# Patient Record
Sex: Male | Born: 1965 | Race: White | Hispanic: No | Marital: Single | Smoking: Current every day smoker
Health system: Southern US, Community
[De-identification: ages and names within clinical notes are randomized; demographics above are authoritative.]

## PROBLEM LIST (undated history)

## (undated) DIAGNOSIS — R519 Headache, unspecified: Secondary | ICD-10-CM

## (undated) DIAGNOSIS — M792 Neuralgia and neuritis, unspecified: Secondary | ICD-10-CM

## (undated) DIAGNOSIS — K439 Ventral hernia without obstruction or gangrene: Secondary | ICD-10-CM

## (undated) DIAGNOSIS — M5412 Radiculopathy, cervical region: Secondary | ICD-10-CM

## (undated) DIAGNOSIS — G4730 Sleep apnea, unspecified: Secondary | ICD-10-CM

## (undated) DIAGNOSIS — I10 Essential (primary) hypertension: Secondary | ICD-10-CM

## (undated) DIAGNOSIS — M1990 Unspecified osteoarthritis, unspecified site: Secondary | ICD-10-CM

## (undated) HISTORY — PX: OTHER SURGICAL HISTORY: SHX169

## (undated) HISTORY — PX: POSTERIOR FUSION LUMBAR SPINE: SUR632

## (undated) HISTORY — PX: APPENDECTOMY: SHX54

## (undated) HISTORY — PX: LUMBAR DISC SURGERY: SHX700

## (undated) HISTORY — PX: COLONOSCOPY: SHX174

## (undated) HISTORY — PX: TONSILLECTOMY: SUR1361

## (undated) HISTORY — PX: CERVICAL SPINE SURGERY: SHX589

## (undated) HISTORY — PX: BACK SURGERY: SHX140

## (undated) HISTORY — PX: SHOULDER ARTHROSCOPY: SHX128

## (undated) HISTORY — PX: NASAL ENDOSCOPY: SHX286

---

## 2019-01-12 ENCOUNTER — Other Ambulatory Visit: Payer: Self-pay | Admitting: Orthopedic Surgery

## 2019-01-15 ENCOUNTER — Other Ambulatory Visit (HOSPITAL_COMMUNITY)
Admission: RE | Admit: 2019-01-15 | Discharge: 2019-01-15 | Disposition: A | Discharge status: Home / Self Care | Payer: Worker's Compensation | Source: Ambulatory Visit | Attending: Orthopedic Surgery | Admitting: Orthopedic Surgery

## 2019-01-15 DIAGNOSIS — Z0184 Encounter for antibody response examination: Secondary | ICD-10-CM | POA: Diagnosis not present

## 2019-01-15 DIAGNOSIS — I10 Essential (primary) hypertension: Secondary | ICD-10-CM | POA: Diagnosis not present

## 2019-01-15 DIAGNOSIS — Z87891 Personal history of nicotine dependence: Secondary | ICD-10-CM | POA: Diagnosis not present

## 2019-01-15 DIAGNOSIS — M4802 Spinal stenosis, cervical region: Secondary | ICD-10-CM | POA: Diagnosis not present

## 2019-01-15 DIAGNOSIS — M5412 Radiculopathy, cervical region: Secondary | ICD-10-CM | POA: Diagnosis present

## 2019-01-15 DIAGNOSIS — G4730 Sleep apnea, unspecified: Secondary | ICD-10-CM | POA: Diagnosis not present

## 2019-01-15 DIAGNOSIS — Z79899 Other long term (current) drug therapy: Secondary | ICD-10-CM | POA: Diagnosis not present

## 2019-01-15 DIAGNOSIS — Z20828 Contact with and (suspected) exposure to other viral communicable diseases: Secondary | ICD-10-CM | POA: Diagnosis not present

## 2019-01-15 LAB — SARS CORONAVIRUS 2 (TAT 6-24 HRS): SARS Coronavirus 2: NEGATIVE

## 2019-01-16 NOTE — Progress Notes (Signed)
[facility] - [place], [place] - [id number] [place] [id number] Phone: [phone] Fax: [phone]      Your procedure is scheduled on January 18, 2019.  Report to [facility] Main Entrance "A" at 09:00 A.M., and check in at the Admitting office.  Call this number if you have problems the morning of surgery:  [phone]  Call [phone] if you have any questions prior to your surgery date Monday-Friday 8am-4pm    Remember:  Do not eat after midnight the night before your surgery  You may drink clear liquids until 9:00am the morning of your surgery.   Clear liquids allowed are: Water, Non-Citrus Juices (without pulp), Carbonated Beverages, Clear Tea, Black Coffee Only, and Gatorade.   Enhanced Recovery after Surgery for Orthopedics Enhanced Recovery after Surgery is a protocol used to improve the stress on your body and your recovery after surgery.  Patient Instructions  . The night before surgery:  o No food after midnight. ONLY clear liquids after midnight  .  Marland Kitchen. The day of surgery (if you do NOT have diabetes):  o Drink ONE (1) Pre-Surgery Clear Ensure as directed.   o This drink was given to you during your hospital  pre-op appointment visit. o The pre-op nurse will instruct you on the time to drink the  Pre-Surgery Ensure depending on your surgery time. o Finish the drink at the designated time by the pre-op nurse.  o Nothing else to drink after completing the  Pre-Surgery Clear Ensure.    Take these medicines the morning of surgery with A SIP OF WATER : NONE  7 days prior to surgery STOP taking any Aspirin (unless otherwise instructed by your surgeon), Aleve, Naproxen, Ibuprofen, Motrin, Advil, Goody's, BC's, all herbal medications, fish oil, and all vitamins.    The Morning of Surgery  Do not wear jewelry.  Do not wear lotions, powders, or perfumes/colognes, or deodorant  Do not shave 48 hours prior to surgery.  Men may shave  face and neck.  Do not bring valuables to the hospital.  [facility] is not responsible for any belongings or valuables.  If you are a smoker, DO NOT Smoke 24 hours prior to surgery  IF you wear a CPAP at night please bring your mask, tubing, and machine the morning of surgery   Remember that you must have someone to transport you home after your surgery, and remain with you for 24 hours if you are discharged the same day.   Contacts, glasses, hearing aids, dentures or bridgework may not be worn into surgery.    Leave your suitcase in the car.  After surgery it may be brought to your room.  For patients admitted to the hospital, discharge time will be determined by your treatment team.  Patients discharged the day of surgery will not be allowed to drive home.    Special instructions:   [facility]- Preparing For Surgery  Before surgery, you can play an important role. Because skin is not sterile, your skin needs to be as free of germs as possible. You can reduce the number of germs on your skin by washing with CHG (chlorahexidine gluconate) Soap before surgery.  CHG is an antiseptic cleaner which kills germs and bonds with the skin to continue killing germs even after washing.    Oral Hygiene is also important to reduce your risk of infection.  Remember - BRUSH YOUR TEETH THE MORNING OF SURGERY WITH YOUR  REGULAR TOOTHPASTE  Please do not use if you have an allergy to CHG or antibacterial soaps. If your skin becomes reddened/irritated stop using the CHG.  Do not shave (including legs and underarms) for at least 48 hours prior to first CHG shower. It is OK to shave your face.  Please follow these instructions carefully.   1. Shower the NIGHT BEFORE SURGERY and the MORNING OF SURGERY with CHG Soap.   2. If you chose to wash your hair, wash your hair first as usual with your normal shampoo.  3. After you shampoo, rinse your hair and body thoroughly to remove the shampoo.  4. Use  CHG as you would any other liquid soap. You can apply CHG directly to the skin and wash gently with a scrungie or a clean washcloth.   5. Apply the CHG Soap to your body ONLY FROM THE NECK DOWN.  Do not use on open wounds or open sores. Avoid contact with your eyes, ears, mouth and genitals (private parts). Wash Face and genitals (private parts)  with your normal soap.   6. Wash thoroughly, paying special attention to the area where your surgery will be performed.  7. Thoroughly rinse your body with warm water from the neck down.  8. DO NOT shower/wash with your normal soap after using and rinsing off the CHG Soap.  9. Pat yourself dry with a CLEAN TOWEL.  10. Wear CLEAN PAJAMAS to bed the night before surgery, wear comfortable clothes the morning of surgery  11. Place CLEAN SHEETS on your bed the night of your first shower and DO NOT SLEEP WITH PETS.    Day of Surgery:  Do not apply any deodorants/lotions. Please shower the morning of surgery with the CHG soap  Please wear clean clothes to the hospital/surgery center.   Remember to brush your teeth WITH YOUR REGULAR TOOTHPASTE.   Please read over the following fact sheets that you were given.

## 2019-01-17 ENCOUNTER — Other Ambulatory Visit: Payer: Self-pay

## 2019-01-17 ENCOUNTER — Encounter (HOSPITAL_COMMUNITY): Payer: Self-pay

## 2019-01-17 ENCOUNTER — Encounter (HOSPITAL_COMMUNITY)
Admission: RE | Admit: 2019-01-17 | Discharge: 2019-01-17 | Disposition: A | Discharge status: Home / Self Care | Payer: Worker's Compensation | Source: Ambulatory Visit | Attending: Orthopedic Surgery | Admitting: Orthopedic Surgery

## 2019-01-17 DIAGNOSIS — Z01818 Encounter for other preprocedural examination: Secondary | ICD-10-CM | POA: Insufficient documentation

## 2019-01-17 DIAGNOSIS — I10 Essential (primary) hypertension: Secondary | ICD-10-CM | POA: Insufficient documentation

## 2019-01-17 DIAGNOSIS — Z20828 Contact with and (suspected) exposure to other viral communicable diseases: Secondary | ICD-10-CM | POA: Diagnosis not present

## 2019-01-17 DIAGNOSIS — M5412 Radiculopathy, cervical region: Secondary | ICD-10-CM | POA: Diagnosis not present

## 2019-01-17 DIAGNOSIS — M4802 Spinal stenosis, cervical region: Secondary | ICD-10-CM | POA: Diagnosis not present

## 2019-01-17 HISTORY — DX: Neuralgia and neuritis, unspecified: M79.2

## 2019-01-17 HISTORY — DX: Sleep apnea, unspecified: G47.30

## 2019-01-17 HISTORY — DX: Radiculopathy, cervical region: M54.12

## 2019-01-17 HISTORY — DX: Essential (primary) hypertension: I10

## 2019-01-17 LAB — COMPREHENSIVE METABOLIC PANEL
ALT: 33 U/L (ref 0–44)
AST: 19 U/L (ref 15–41)
Albumin: 4.3 g/dL (ref 3.5–5.0)
Alkaline Phosphatase: 55 U/L (ref 38–126)
Anion gap: 11 (ref 5–15)
BUN: 17 mg/dL (ref 6–20)
CO2: 25 mmol/L (ref 22–32)
Calcium: 9.5 mg/dL (ref 8.9–10.3)
Chloride: 101 mmol/L (ref 98–111)
Creatinine, Ser: 0.91 mg/dL (ref 0.61–1.24)
GFR calc Af Amer: >60 mL/min (ref >60)
GFR calc non Af Amer: >60 mL/min (ref >60)
Glucose, Bld: 115 mg/dL — ABNORMAL HIGH (ref 70–99)
Potassium: 4.4 mmol/L (ref 3.5–5.1)
Sodium: 137 mmol/L (ref 135–145)
Total Bilirubin: 1.4 mg/dL — ABNORMAL HIGH (ref 0.3–1.2)
Total Protein: 7.7 g/dL (ref 6.5–8.1)

## 2019-01-17 LAB — ABO/RH: ABO/RH(D): A NEG

## 2019-01-17 LAB — CBC WITH DIFFERENTIAL/PLATELET
Abs Immature Granulocytes: 0.06 10*3/uL (ref 0.00–0.07)
Basophils Absolute: 0.1 10*3/uL (ref 0.0–0.1)
Basophils Relative: 1 %
Eosinophils Absolute: 0.1 10*3/uL (ref 0.0–0.5)
Eosinophils Relative: 1 %
HCT: 48.3 % (ref 39.0–52.0)
Hemoglobin: 16.1 g/dL (ref 13.0–17.0)
Immature Granulocytes: 1 %
Lymphocytes Relative: 30 %
Lymphs Abs: 2.2 10*3/uL (ref 0.7–4.0)
MCH: 31 pg (ref 26.0–34.0)
MCHC: 33.3 g/dL (ref 30.0–36.0)
MCV: 93.1 fL (ref 80.0–100.0)
Monocytes Absolute: 0.5 10*3/uL (ref 0.1–1.0)
Monocytes Relative: 8 %
Neutro Abs: 4.2 10*3/uL (ref 1.7–7.7)
Neutrophils Relative %: 59 %
Platelets: 299 10*3/uL (ref 150–400)
RBC: 5.19 MIL/uL (ref 4.22–5.81)
RDW: 12.6 % (ref 11.5–15.5)
WBC: 7.1 10*3/uL (ref 4.0–10.5)
nRBC: 0 % (ref 0.0–0.2)

## 2019-01-17 LAB — URINALYSIS, ROUTINE W REFLEX MICROSCOPIC
Bilirubin Urine: NEGATIVE
Glucose, UA: NEGATIVE mg/dL
Hgb urine dipstick: NEGATIVE
Ketones, ur: NEGATIVE mg/dL
Leukocytes,Ua: NEGATIVE
Nitrite: NEGATIVE
Protein, ur: NEGATIVE mg/dL
Specific Gravity, Urine: 1.017 (ref 1.005–1.030)
pH: 5 (ref 5.0–8.0)

## 2019-01-17 LAB — SURGICAL PCR SCREEN
MRSA, PCR: NEGATIVE
Staphylococcus aureus: POSITIVE — AB

## 2019-01-17 LAB — APTT: aPTT: 30 seconds (ref 24–36)

## 2019-01-17 LAB — TYPE AND SCREEN
ABO/RH(D): A NEG
Antibody Screen: NEGATIVE

## 2019-01-17 LAB — PROTIME-INR
INR: 1 (ref 0.8–1.2)
Prothrombin Time: 12.8 seconds (ref 11.4–15.2)

## 2019-01-17 MED ORDER — DEXTROSE 5 % IV SOLN
3.0000 g | INTRAVENOUS | Status: AC
  Administered 2019-01-18: 3 g via INTRAVENOUS
  Filled 2019-01-17: qty 3000

## 2019-01-17 NOTE — Progress Notes (Signed)
PCP -  Dr. Dian Situ Cardiologist - denies  Chest x-ray - denies EKG - 01/17/2019 Stress Test - 05/26/17 - [facility] ECHO - patient not sure Cardiac Cath - denies  Sleep Study - YES CPAP - YES  Blood Thinner Instructions: N/A Aspirin Instructions: N/A  Anesthesia review: YES, cardiac hx - cardiac records requested  Coronavirus Screening  Have you experienced the following symptoms:  Cough yes/no: No Fever (>100.5F)  yes/no: No Runny nose yes/no: No Sore throat yes/no: No Difficulty breathing/shortness of breath  yes/no: No  Have you or a family member traveled in the last 14 days and where? yes/no: No  If the patient indicates "YES" to the above questions, their PAT will be rescheduled to limit the exposure to others and, the surgeon will be notified. THE PATIENT WILL NEED TO BE ASYMPTOMATIC FOR 14 DAYS.   If the patient is not experiencing any of these symptoms, the PAT nurse will instruct them to NOT bring anyone with them to their appointment since they may have these symptoms or traveled as well.   Please remind your patients and families that hospital visitation restrictions are in effect and the importance of the restrictions.   Patient denies shortness of breath, fever, cough and chest pain at PAT appointment  Patient verbalized understanding of instructions that were given to them at the PAT appointment. Patient was also instructed that they will need to review over the PAT instructions again at home before surgery.

## 2019-01-17 NOTE — Anesthesia Preprocedure Evaluation (Addendum)
Anesthesia Evaluation  Patient identified by MRN, date of birth, ID band Patient awake    Reviewed: Allergy & Precautions, NPO status , Patient's Chart, lab work & pertinent test results  History of Anesthesia Complications Negative for: history of anesthetic complications  Airway Mallampati: III  TM Distance: >3 FB Neck ROM: Full    Dental  (+) Dental Advisory Given   Pulmonary neg shortness of breath, sleep apnea and Continuous Positive Airway Pressure Ventilation , neg recent URI, Not current smoker, former smoker,    breath sounds clear to auscultation       Cardiovascular hypertension, Pt. on medications (-) angina(-) Past MI  Rhythm:Regular     Neuro/Psych  Neuromuscular disease negative psych ROS   GI/Hepatic negative GI ROS, Neg liver ROS,   Endo/Other  Morbid obesity  Renal/GU negative Renal ROS     Musculoskeletal negative musculoskeletal ROS (+)   Abdominal   Peds  Hematology negative hematology ROS (+)   Anesthesia Other Findings   Reproductive/Obstetrics                            Anesthesia Physical Anesthesia Plan  ASA: III  Anesthesia Plan: General   Post-op Pain Management:    Induction: Intravenous  PONV Risk Score and Plan: 2 and Ondansetron and Dexamethasone  Airway Management Planned: Video Laryngoscope Planned and Oral ETT  Additional Equipment: None  Intra-op Plan:   Post-operative Plan: Extubation in OR  Informed Consent: I have reviewed the patients History and Physical, chart, labs and discussed the procedure including the risks, benefits and alternatives for the proposed anesthesia with the patient or authorized representative who has indicated his/her understanding and acceptance.     Dental advisory given  Plan Discussed with: CRNA and Surgeon  Anesthesia Plan Comments: (Pt had stress test in 2019 ordered by PCP to eval chest  tightness/pressure. Scan showed no ischemia, borderline low EF 44%. No further testing was recommended per notes.   Anesthesia records from 01/2018 for nasal septum reconstruction/turbinate reduction at [facility] indicate difficult airway. Unsuccessful direct laryngoscopy followed by successful glidescope.   Nuclear stress 05/26/17 (care everywhere): 1. No inducible ischemia.  2. Borderline low left ventricular function.EF 44%.   )      Anesthesia Quick Evaluation

## 2019-01-18 ENCOUNTER — Ambulatory Visit (HOSPITAL_COMMUNITY): Payer: Worker's Compensation | Admitting: Certified Registered Nurse Anesthetist

## 2019-01-18 ENCOUNTER — Ambulatory Visit (HOSPITAL_COMMUNITY)
Admission: RE | Admit: 2019-01-18 | Discharge: 2019-01-19 | Disposition: A | Discharge status: Home / Self Care | Payer: Worker's Compensation | Attending: Orthopedic Surgery | Admitting: Orthopedic Surgery

## 2019-01-18 ENCOUNTER — Other Ambulatory Visit: Payer: Self-pay

## 2019-01-18 ENCOUNTER — Ambulatory Visit (HOSPITAL_COMMUNITY): Payer: Worker's Compensation

## 2019-01-18 ENCOUNTER — Encounter (HOSPITAL_COMMUNITY)
Admission: RE | Disposition: A | Discharge status: Home / Self Care | Payer: Self-pay | Source: Home / Self Care | Attending: Orthopedic Surgery

## 2019-01-18 ENCOUNTER — Ambulatory Visit (HOSPITAL_COMMUNITY): Payer: Worker's Compensation | Admitting: Physician Assistant

## 2019-01-18 ENCOUNTER — Encounter (HOSPITAL_COMMUNITY): Payer: Self-pay | Admitting: *Deleted

## 2019-01-18 DIAGNOSIS — Z87891 Personal history of nicotine dependence: Secondary | ICD-10-CM | POA: Insufficient documentation

## 2019-01-18 DIAGNOSIS — I10 Essential (primary) hypertension: Secondary | ICD-10-CM | POA: Diagnosis not present

## 2019-01-18 DIAGNOSIS — M4802 Spinal stenosis, cervical region: Secondary | ICD-10-CM | POA: Insufficient documentation

## 2019-01-18 DIAGNOSIS — Z0184 Encounter for antibody response examination: Secondary | ICD-10-CM | POA: Insufficient documentation

## 2019-01-18 DIAGNOSIS — M5412 Radiculopathy, cervical region: Secondary | ICD-10-CM | POA: Insufficient documentation

## 2019-01-18 DIAGNOSIS — M5410 Radiculopathy, site unspecified: Secondary | ICD-10-CM | POA: Diagnosis present

## 2019-01-18 DIAGNOSIS — G4730 Sleep apnea, unspecified: Secondary | ICD-10-CM | POA: Insufficient documentation

## 2019-01-18 DIAGNOSIS — Z419 Encounter for procedure for purposes other than remedying health state, unspecified: Secondary | ICD-10-CM

## 2019-01-18 DIAGNOSIS — Z20828 Contact with and (suspected) exposure to other viral communicable diseases: Secondary | ICD-10-CM | POA: Insufficient documentation

## 2019-01-18 DIAGNOSIS — Z79899 Other long term (current) drug therapy: Secondary | ICD-10-CM | POA: Insufficient documentation

## 2019-01-18 HISTORY — PX: ANTERIOR CERVICAL DECOMP/DISCECTOMY FUSION: SHX1161

## 2019-01-18 SURGERY — ANTERIOR CERVICAL DECOMPRESSION/DISCECTOMY FUSION 2 LEVELS
Anesthesia: General | Site: Spine Cervical

## 2019-01-18 MED ORDER — PANTOPRAZOLE SODIUM 40 MG IV SOLR: 40.0000 mg | Freq: Every day | INTRAVENOUS | Status: DC

## 2019-01-18 MED ORDER — DEXMEDETOMIDINE HCL 200 MCG/2ML IV SOLN
INTRAVENOUS | Status: DC | PRN
  Administered 2019-01-18 (×3): 20 ug via INTRAVENOUS

## 2019-01-18 MED ORDER — SODIUM CHLORIDE 0.9% FLUSH
3.0000 mL | Freq: Two times a day (BID) | INTRAVENOUS | Status: DC
  Administered 2019-01-18: 3 mL via INTRAVENOUS

## 2019-01-18 MED ORDER — SODIUM CHLORIDE 0.9% FLUSH: 3.0000 mL | INTRAVENOUS | Status: DC | PRN

## 2019-01-18 MED ORDER — ACETAMINOPHEN 325 MG PO TABS: 650.0000 mg | ORAL_TABLET | ORAL | Status: DC | PRN

## 2019-01-18 MED ORDER — ONDANSETRON HCL 4 MG/2ML IJ SOLN
INTRAMUSCULAR | Status: DC | PRN
  Administered 2019-01-18: 4 mg via INTRAVENOUS

## 2019-01-18 MED ORDER — THROMBIN 20000 UNITS EX SOLR
CUTANEOUS | Status: AC
  Filled 2019-01-18: qty 20000

## 2019-01-18 MED ORDER — DEXAMETHASONE SODIUM PHOSPHATE 10 MG/ML IJ SOLN
INTRAMUSCULAR | Status: AC
  Filled 2019-01-18: qty 1

## 2019-01-18 MED ORDER — 0.9 % SODIUM CHLORIDE (POUR BTL) OPTIME
TOPICAL | Status: DC | PRN
  Administered 2019-01-18: 1000 mL

## 2019-01-18 MED ORDER — SUCCINYLCHOLINE CHLORIDE 200 MG/10ML IV SOSY
PREFILLED_SYRINGE | INTRAVENOUS | Status: DC | PRN
  Administered 2019-01-18: 120 mg via INTRAVENOUS

## 2019-01-18 MED ORDER — LIDOCAINE 20MG/ML (2%) 15 ML SYRINGE OPTIME
INTRAMUSCULAR | Status: DC | PRN
  Administered 2019-01-18: 80 mg via INTRAVENOUS

## 2019-01-18 MED ORDER — ACETAMINOPHEN 160 MG/5ML PO SOLN: 1000.0000 mg | Freq: Once | ORAL | Status: DC | PRN

## 2019-01-18 MED ORDER — PROPOFOL 10 MG/ML IV BOLUS
INTRAVENOUS | Status: AC
  Filled 2019-01-18: qty 20

## 2019-01-18 MED ORDER — THROMBIN 20000 UNITS EX SOLR
CUTANEOUS | Status: DC | PRN
  Administered 2019-01-18: 20 mL via TOPICAL

## 2019-01-18 MED ORDER — PANTOPRAZOLE SODIUM 40 MG PO TBEC
40.0000 mg | DELAYED_RELEASE_TABLET | Freq: Every day | ORAL | Status: DC
  Administered 2019-01-18: 21:00:00 40 mg via ORAL
  Filled 2019-01-18: qty 1

## 2019-01-18 MED ORDER — ALUM & MAG HYDROXIDE-SIMETH 200-200-20 MG/5ML PO SUSP: 30.0000 mL | Freq: Four times a day (QID) | ORAL | Status: DC | PRN

## 2019-01-18 MED ORDER — ACETAMINOPHEN 650 MG RE SUPP: 650.0000 mg | RECTAL | Status: DC | PRN

## 2019-01-18 MED ORDER — FENTANYL CITRATE (PF) 250 MCG/5ML IJ SOLN
INTRAMUSCULAR | Status: AC
  Filled 2019-01-18: qty 5

## 2019-01-18 MED ORDER — ONDANSETRON HCL 4 MG/2ML IJ SOLN
4.0000 mg | Freq: Four times a day (QID) | INTRAMUSCULAR | Status: DC | PRN
  Administered 2019-01-19: 4 mg via INTRAVENOUS
  Filled 2019-01-18: qty 2

## 2019-01-18 MED ORDER — MIDAZOLAM HCL 5 MG/5ML IJ SOLN
INTRAMUSCULAR | Status: DC | PRN
  Administered 2019-01-18: 2 mg via INTRAVENOUS

## 2019-01-18 MED ORDER — POVIDONE-IODINE 7.5 % EX SOLN
Freq: Once | CUTANEOUS | Status: DC
  Filled 2019-01-18: qty 118

## 2019-01-18 MED ORDER — PROPOFOL 10 MG/ML IV BOLUS
INTRAVENOUS | Status: DC | PRN
  Administered 2019-01-18: 40 mg via INTRAVENOUS
  Administered 2019-01-18: 140 mg via INTRAVENOUS

## 2019-01-18 MED ORDER — CEFAZOLIN SODIUM-DEXTROSE 2-4 GM/100ML-% IV SOLN
2.0000 g | Freq: Three times a day (TID) | INTRAVENOUS | Status: AC
  Administered 2019-01-18 (×2): 2 g via INTRAVENOUS
  Filled 2019-01-18 (×2): qty 100

## 2019-01-18 MED ORDER — OXYCODONE-ACETAMINOPHEN 5-325 MG PO TABS
1.0000 | ORAL_TABLET | ORAL | Status: DC | PRN
  Administered 2019-01-18 – 2019-01-19 (×5): 2 via ORAL
  Filled 2019-01-18 (×5): qty 2

## 2019-01-18 MED ORDER — PHENOL 1.4 % MT LIQD: 1.0000 | OROMUCOSAL | Status: DC | PRN

## 2019-01-18 MED ORDER — ROCURONIUM BROMIDE 10 MG/ML (PF) SYRINGE
PREFILLED_SYRINGE | INTRAVENOUS | Status: AC
  Filled 2019-01-18: qty 10

## 2019-01-18 MED ORDER — ONDANSETRON HCL 4 MG PO TABS: 4.0000 mg | ORAL_TABLET | Freq: Four times a day (QID) | ORAL | Status: DC | PRN

## 2019-01-18 MED ORDER — OXYCODONE HCL 5 MG PO TABS: 5.0000 mg | ORAL_TABLET | Freq: Once | ORAL | Status: DC | PRN

## 2019-01-18 MED ORDER — LIDOCAINE 2% (20 MG/ML) 5 ML SYRINGE
INTRAMUSCULAR | Status: AC
  Filled 2019-01-18: qty 5

## 2019-01-18 MED ORDER — BISACODYL 5 MG PO TBEC
5.0000 mg | DELAYED_RELEASE_TABLET | Freq: Every day | ORAL | Status: DC | PRN
  Administered 2019-01-19: 5 mg via ORAL
  Filled 2019-01-18: qty 1

## 2019-01-18 MED ORDER — BUPIVACAINE-EPINEPHRINE (PF) 0.25% -1:200000 IJ SOLN
INTRAMUSCULAR | Status: AC
  Filled 2019-01-18: qty 30

## 2019-01-18 MED ORDER — ACETAMINOPHEN 10 MG/ML IV SOLN
1000.0000 mg | Freq: Once | INTRAVENOUS | Status: DC | PRN
  Administered 2019-01-18: 1000 mg via INTRAVENOUS

## 2019-01-18 MED ORDER — DIAZEPAM 5 MG PO TABS
5.0000 mg | ORAL_TABLET | Freq: Four times a day (QID) | ORAL | Status: DC | PRN
  Administered 2019-01-18 – 2019-01-19 (×3): 5 mg via ORAL
  Filled 2019-01-18 (×3): qty 1

## 2019-01-18 MED ORDER — SODIUM CHLORIDE 0.9 % IV SOLN: 250.0000 mL | INTRAVENOUS | Status: DC

## 2019-01-18 MED ORDER — ONDANSETRON HCL 4 MG/2ML IJ SOLN
INTRAMUSCULAR | Status: AC
  Filled 2019-01-18: qty 2

## 2019-01-18 MED ORDER — OXYCODONE HCL 5 MG PO TABS
ORAL_TABLET | ORAL | Status: AC
  Filled 2019-01-18: qty 1

## 2019-01-18 MED ORDER — FENTANYL CITRATE (PF) 100 MCG/2ML IJ SOLN
INTRAMUSCULAR | Status: AC
  Filled 2019-01-18: qty 2

## 2019-01-18 MED ORDER — SUGAMMADEX SODIUM 200 MG/2ML IV SOLN
INTRAVENOUS | Status: DC | PRN
  Administered 2019-01-18: 255.8 mg via INTRAVENOUS

## 2019-01-18 MED ORDER — ZOLPIDEM TARTRATE 5 MG PO TABS: 5.0000 mg | ORAL_TABLET | Freq: Every evening | ORAL | Status: DC | PRN

## 2019-01-18 MED ORDER — MIDAZOLAM HCL 2 MG/2ML IJ SOLN
INTRAMUSCULAR | Status: AC
  Filled 2019-01-18: qty 2

## 2019-01-18 MED ORDER — FENTANYL CITRATE (PF) 100 MCG/2ML IJ SOLN
25.0000 ug | INTRAMUSCULAR | Status: DC | PRN
  Administered 2019-01-18: 50 ug via INTRAVENOUS

## 2019-01-18 MED ORDER — ROCURONIUM BROMIDE 10 MG/ML (PF) SYRINGE
PREFILLED_SYRINGE | INTRAVENOUS | Status: DC | PRN
  Administered 2019-01-18: 60 mg via INTRAVENOUS

## 2019-01-18 MED ORDER — BUPIVACAINE-EPINEPHRINE 0.25% -1:200000 IJ SOLN
INTRAMUSCULAR | Status: DC | PRN
  Administered 2019-01-18: 8 mL

## 2019-01-18 MED ORDER — FLEET ENEMA 7-19 GM/118ML RE ENEM: 1.0000 | ENEMA | Freq: Once | RECTAL | Status: DC | PRN

## 2019-01-18 MED ORDER — ACETAMINOPHEN 500 MG PO TABS: 1000.0000 mg | ORAL_TABLET | Freq: Once | ORAL | Status: DC | PRN

## 2019-01-18 MED ORDER — LACTATED RINGERS IV SOLN
INTRAVENOUS | Status: DC
  Administered 2019-01-18: 09:00:00 via INTRAVENOUS

## 2019-01-18 MED ORDER — ACETAMINOPHEN 10 MG/ML IV SOLN
INTRAVENOUS | Status: AC
  Filled 2019-01-18: qty 100

## 2019-01-18 MED ORDER — SODIUM CHLORIDE 0.9 % IV SOLN
INTRAVENOUS | Status: DC | PRN
  Administered 2019-01-18: 25 ug/min via INTRAVENOUS

## 2019-01-18 MED ORDER — LISINOPRIL 10 MG PO TABS
10.0000 mg | ORAL_TABLET | Freq: Every day | ORAL | Status: DC
  Administered 2019-01-19: 10 mg via ORAL
  Filled 2019-01-18: qty 1

## 2019-01-18 MED ORDER — OXYCODONE HCL 5 MG/5ML PO SOLN: 5.0000 mg | Freq: Once | ORAL | Status: DC | PRN

## 2019-01-18 MED ORDER — FENTANYL CITRATE (PF) 100 MCG/2ML IJ SOLN
INTRAMUSCULAR | Status: DC | PRN
  Administered 2019-01-18: 50 ug via INTRAVENOUS
  Administered 2019-01-18: 150 ug via INTRAVENOUS
  Administered 2019-01-18: 50 ug via INTRAVENOUS

## 2019-01-18 MED ORDER — SENNOSIDES-DOCUSATE SODIUM 8.6-50 MG PO TABS: 1.0000 | ORAL_TABLET | Freq: Every evening | ORAL | Status: DC | PRN

## 2019-01-18 MED ORDER — MENTHOL 3 MG MT LOZG: 1.0000 | LOZENGE | OROMUCOSAL | Status: DC | PRN

## 2019-01-18 SURGICAL SUPPLY — 76 items
BENZOIN TINCTURE PRP APPL 2/3 (GAUZE/BANDAGES/DRESSINGS) ×2 IMPLANT
BIT DRILL NEURO 2X3.1 SFT TUCH (MISCELLANEOUS) ×1 IMPLANT
BIT DRILL SRG 14X2.2XFLT CHK (BIT) IMPLANT
BIT DRL SRG 14X2.2XFLT CHK (BIT) ×1
BLADE CLIPPER SURG (BLADE) ×2 IMPLANT
BLADE SURG 15 STRL LF DISP TIS (BLADE) ×1 IMPLANT
BLADE SURG 15 STRL SS (BLADE) ×1
BUR MATCHSTICK NEURO 3.0 LAGG (BURR) IMPLANT
CARTRIDGE OIL MAESTRO DRILL (MISCELLANEOUS) ×1 IMPLANT
COLLAR CERV LO CONTOUR FIRM DE (SOFTGOODS) IMPLANT
CORD BIPOLAR FORCEPS 12FT (ELECTRODE) ×2 IMPLANT
COVER SURGICAL LIGHT HANDLE (MISCELLANEOUS) ×2 IMPLANT
COVER WAND RF STERILE (DRAPES) ×2 IMPLANT
DECANTER SPIKE VIAL GLASS SM (MISCELLANEOUS) ×2 IMPLANT
DIFFUSER DRILL AIR PNEUMATIC (MISCELLANEOUS) ×2 IMPLANT
DRAIN JACKSON RD 7FR 3/32 (WOUND CARE) IMPLANT
DRAPE C-ARM 42X72 X-RAY (DRAPES) ×2 IMPLANT
DRAPE POUCH INSTRU U-SHP 10X18 (DRAPES) ×2 IMPLANT
DRAPE SURG 17X23 STRL (DRAPES) ×8 IMPLANT
DRILL BIT SKYLINE 14MM (BIT) ×1
DRILL NEURO 2X3.1 SOFT TOUCH (MISCELLANEOUS) ×2
DURAPREP 26ML APPLICATOR (WOUND CARE) ×2 IMPLANT
ELECT COATED BLADE 2.86 ST (ELECTRODE) ×2 IMPLANT
ELECT REM PT RETURN 9FT ADLT (ELECTROSURGICAL) ×2
ELECTRODE REM PT RTRN 9FT ADLT (ELECTROSURGICAL) ×1 IMPLANT
EVACUATOR SILICONE 100CC (DRAIN) IMPLANT
GAUZE 4X4 16PLY RFD (DISPOSABLE) ×2 IMPLANT
GAUZE SPONGE 4X4 12PLY STRL (GAUZE/BANDAGES/DRESSINGS) ×2 IMPLANT
GLOVE BIO SURGEON STRL SZ7 (GLOVE) ×2 IMPLANT
GLOVE BIO SURGEON STRL SZ8 (GLOVE) ×2 IMPLANT
GLOVE BIOGEL PI IND STRL 7.0 (GLOVE) ×2 IMPLANT
GLOVE BIOGEL PI IND STRL 8 (GLOVE) ×1 IMPLANT
GLOVE BIOGEL PI INDICATOR 7.0 (GLOVE) ×2
GLOVE BIOGEL PI INDICATOR 8 (GLOVE) ×1
GOWN STRL REUS W/ TWL LRG LVL3 (GOWN DISPOSABLE) ×1 IMPLANT
GOWN STRL REUS W/ TWL XL LVL3 (GOWN DISPOSABLE) ×1 IMPLANT
GOWN STRL REUS W/TWL LRG LVL3 (GOWN DISPOSABLE) ×1
GOWN STRL REUS W/TWL XL LVL3 (GOWN DISPOSABLE) ×1
INTERLOCK LRDTC CRVCL VBR 8MM (Peek) IMPLANT
IV CATH 14GX2 1/4 (CATHETERS) ×2 IMPLANT
KIT BASIN OR (CUSTOM PROCEDURE TRAY) ×2 IMPLANT
KIT TURNOVER KIT B (KITS) ×2 IMPLANT
LORDOTIC CERVICAL VBR 8MM SM (Peek) ×2 IMPLANT
MANIFOLD NEPTUNE II (INSTRUMENTS) ×2 IMPLANT
NDL PRECISIONGLIDE 27X1.5 (NEEDLE) ×1 IMPLANT
NDL SPNL 20GX3.5 QUINCKE YW (NEEDLE) ×1 IMPLANT
NEEDLE PRECISIONGLIDE 27X1.5 (NEEDLE) ×2 IMPLANT
NEEDLE SPNL 20GX3.5 QUINCKE YW (NEEDLE) ×2 IMPLANT
NS IRRIG 1000ML POUR BTL (IV SOLUTION) ×2 IMPLANT
OIL CARTRIDGE MAESTRO DRILL (MISCELLANEOUS) ×2
PACK ORTHO CERVICAL (CUSTOM PROCEDURE TRAY) ×2 IMPLANT
PAD ARMBOARD 7.5X6 YLW CONV (MISCELLANEOUS) ×4 IMPLANT
PATTIES SURGICAL .5 X.5 (GAUZE/BANDAGES/DRESSINGS) IMPLANT
PATTIES SURGICAL .5 X1 (DISPOSABLE) ×2 IMPLANT
PIN DISTRACTION 14 (PIN) ×2 IMPLANT
PLATE ONE LEVEL SKYLINE 14MM (Plate) ×1 IMPLANT
POSITIONER HEAD DONUT 9IN (MISCELLANEOUS) ×2 IMPLANT
PUTTY BONE DBX 2.5 MIS (Bone Implant) ×1 IMPLANT
SCREW SKYLINE VAR OS 14MM (Screw) ×4 IMPLANT
SPONGE INTESTINAL PEANUT (DISPOSABLE) ×2 IMPLANT
SPONGE SURGIFOAM ABS GEL 100 (HEMOSTASIS) ×2 IMPLANT
STRIP CLOSURE SKIN 1/2X4 (GAUZE/BANDAGES/DRESSINGS) ×2 IMPLANT
SURGIFLO W/THROMBIN 8M KIT (HEMOSTASIS) IMPLANT
SUT MNCRL AB 4-0 PS2 18 (SUTURE) ×2 IMPLANT
SUT SILK 4 0 (SUTURE)
SUT SILK 4-0 18XBRD TIE 12 (SUTURE) IMPLANT
SUT VIC AB 2-0 CT2 18 VCP726D (SUTURE) ×2 IMPLANT
SYR BULB IRRIGATION 50ML (SYRINGE) ×2 IMPLANT
SYR CONTROL 10ML LL (SYRINGE) ×6 IMPLANT
TAPE CLOTH 4X10 WHT NS (GAUZE/BANDAGES/DRESSINGS) ×2 IMPLANT
TAPE CLOTH SURG 4X10 WHT LF (GAUZE/BANDAGES/DRESSINGS) ×1 IMPLANT
TAPE UMBILICAL COTTON 1/8X30 (MISCELLANEOUS) ×2 IMPLANT
TOWEL GREEN STERILE (TOWEL DISPOSABLE) ×2 IMPLANT
TOWEL GREEN STERILE FF (TOWEL DISPOSABLE) ×2 IMPLANT
WATER STERILE IRR 1000ML POUR (IV SOLUTION) ×2 IMPLANT
YANKAUER SUCT BULB TIP NO VENT (SUCTIONS) ×2 IMPLANT

## 2019-01-18 NOTE — Anesthesia Procedure Notes (Signed)
Procedure Name: Intubation Date/Time: 01/18/2019 12:44 PM Performed by: Lowella Dell, CRNA Pre-anesthesia Checklist: Patient identified, Emergency Drugs available, Suction available and Patient being monitored Patient Re-evaluated:Patient Re-evaluated prior to induction Oxygen Delivery Method: Circle System Utilized Preoxygenation: Pre-oxygenation with 100% oxygen Induction Type: IV induction and Rapid sequence Laryngoscope Size: Glidescope and 4 Grade View: Grade I Tube type: Oral Tube size: 7.5 mm Number of attempts: 1 Airway Equipment and Method: Video-laryngoscopy and Rigid stylet Placement Confirmation: ETT inserted through vocal cords under direct vision,  positive ETCO2 and breath sounds checked- equal and bilateral Secured at: 22 cm Tube secured with: Tape Dental Injury: Teeth and Oropharynx as per pre-operative assessment  Difficulty Due To: Difficulty was anticipated Future Recommendations: Recommend- induction with short-acting agent, and alternative techniques readily available

## 2019-01-18 NOTE — Transfer of Care (Signed)
Immediate Anesthesia Transfer of Care Note  Patient: Jeffrey Nash  Procedure(s) Performed: ANTERIOR CERVICAL DECOMPRESSION FUSION CERVICAL 6-7 WITH INSTRUMENTATION AND ALLOGRAFT (N/A )  Patient Location: PACU  Anesthesia Type:General  Level of Consciousness: awake and patient cooperative  Airway & Oxygen Therapy: Patient Spontanous Breathing and Patient connected to nasal cannula oxygen  Post-op Assessment: Report given to RN, Post -op Vital signs reviewed and stable and Patient moving all extremities X 4  Post vital signs: Reviewed and stable  Last Vitals:  Vitals Value Taken Time  BP    Temp    Pulse 84 01/18/19 1439  Resp 14 01/18/19 1439  SpO2 96 % 01/18/19 1439  Vitals shown include unvalidated device data.  Last Pain:  Vitals:   01/18/19 0920  TempSrc: Oral  PainSc: 0-No pain      Patients Stated Pain Goal: 4 (41/03/01 3143)  Complications: No apparent anesthesia complications

## 2019-01-18 NOTE — H&P (Signed)
PREOPERATIVE H&P  Chief Complaint: Right arm pain  HPI: Jeffrey PieriniMark Nash is a 53 y.o. male who presents with ongoing pain in the right arm  MRI reveals severe NF stenosis on the right at C6/7  Patient has failed multiple forms of conservative care and continues to have pain (see office notes for additional details regarding the patient's full course of treatment)  Past Medical History:  Diagnosis Date  . Cervical radiculopathy   . Hypertension   . Nerve pain   . Sleep apnea    Past Surgical History:  Procedure Laterality Date  . APPENDECTOMY    . BACK SURGERY    . CERVICAL SPINE SURGERY    . COLONOSCOPY    . LUMBAR DISC SURGERY    . NASAL ENDOSCOPY    . POSTERIOR FUSION LUMBAR SPINE    . SEPTOPLASTY WITH TURBINATE REDUCTION RHINOPLASTY     . SHOULDER ARTHROSCOPY    . TONSILLECTOMY     Social History   Socioeconomic History  . Marital status: Single    Spouse name: Not on file  . Number of children: Not on file  . Years of education: Not on file  . Highest education level: Not on file  Occupational History  . Not on file  Social Needs  . Financial resource strain: Not on file  . Food insecurity    Worry: Not on file    Inability: Not on file  . Transportation needs    Medical: Not on file    Non-medical: Not on file  Tobacco Use  . Smoking status: Former Smoker    Types: Pipe, Cigarettes    Quit date: 02/21/2013    Years since quitting: 5.9  . Smokeless tobacco: Former NeurosurgeonUser    Types: Snuff, Chew  Substance and Sexual Activity  . Alcohol use: Yes    Alcohol/week: 6.0 standard drinks    Types: 6 Cans of beer per week    Comment: rarely once a month  . Drug use: Not Currently  . Sexual activity: Not on file  Lifestyle  . Physical activity    Days per week: Not on file    Minutes per session: Not on file  . Stress: Not on file  Relationships  . Social Musicianconnections    Talks on phone: Not on file    Gets together: Not on file    Attends religious  service: Not on file    Active member of club or organization: Not on file    Attends meetings of clubs or organizations: Not on file    Relationship status: Not on file  Other Topics Concern  . Not on file  Social History Narrative  . Not on file   No family history on file. Allergies  Allergen Reactions  . Bee Venom Anaphylaxis and Swelling   Prior to Admission medications   Medication Sig Start Date End Date Taking? Authorizing Provider  lisinopril (ZESTRIL) 10 MG tablet Take 10 mg by mouth daily.   Yes [provider]     All other systems have been reviewed and were otherwise negative with the exception of those mentioned in the HPI and as above.  Physical Exam: There were no vitals filed for this visit.  There is no height or weight on file to calculate BMI.  General: Alert, no acute distress Cardiovascular: No pedal edema Respiratory: No cyanosis, no use of accessory musculature Skin: No lesions in the area of chief complaint Neurologic: Sensation intact  distally Psychiatric: Patient is competent for consent with normal mood and affect Lymphatic: No axillary or cervical lymphadenopathy  MUSCULOSKELETAL: + spurling sign ont he right  Assessment/Plan: Right C7 RADICULOPATHY Plan for Procedure(s): ANTERIOR CERVICAL DECOMPRESSION FUSION CERVICAL 6-7 WITH INSTRUMENTATION AND ALLOGRAFT   Norva Karvonen, MD 01/18/2019 7:47 AM

## 2019-01-18 NOTE — Op Note (Signed)
PATIENT NAME: Jeffrey Nash   MEDICAL RECORD NO.:   [id number]    PHYSICIAN:  Phylliss Bob, MD      DATE OF BIRTH: 04/02/1966  ASSISTANT: Pricilla Holm, PA-C   DATE OF PROCEDURE: 01/18/2019                               OPERATIVE REPORT     PREOPERATIVE DIAGNOSES: 1. Right-sided cervical radiculopathy. 2. 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   POSTOPERATIVE DIAGNOSES: 1. Right-sided cervical radiculopathy. 2. 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   PROCEDURE: 1. Anterior cervical decompression and fusion C6/7. 2. Placement of anterior instrumentation, C6/7. 3. Insertion of interbody device x1 (32mm Titan intervertebral spacer). 4. Intraoperative use of fluoroscopy. 5. Use of morselized allograft - ViviGen.   SURGEON:  Phylliss Bob, MD   ASSISTANT:  Pricilla Holm, PA-C.   ANESTHESIA:  General endotracheal anesthesia.   COMPLICATIONS:  None.   DISPOSITION:  Stable.   ESTIMATED BLOOD LOSS:  Minimal.   INDICATIONS FOR SURGERY:  Briefly, Jeffrey Nash  is a pleasant 53 -year- old male, who did present to me with severe pain in his neck and right arm.   The patient's MRI did reveal the findings noted above.  Given the ongoing rather debilitating pain and lack of improvement with appropriate treatment measures, we did discuss proceeding with the procedure noted above.  The patient was fully aware of the risks and limitations of surgery as outlined in my preoperative note.   OPERATIVE DETAILS:  On 01/18/2019 , the patient was brought to surgery and general endotracheal anesthesia was administered.  The patient was placed supine on the hospital bed. The neck was gently extended.  All bony prominences were meticulously padded.  The neck was prepped and draped in the usual sterile fashion.  At this point, I did make a left-sided transverse incision.  The platysma was incised.  A Smith-Robinson approach was used and the anterior spine was identified. A self-retaining retractor was placed.  I then  subperiosteally exposed the vertebral bodies from C6-C7.  Caspar pins were then placed into the C6 and C7 vertebral bodies and distraction was applied.  A thorough and complete C6-7 intervertebral diskectomy was performed.  The posterior longitudinal ligament was identified and entered using a nerve hook.  I then used #1 followed by #2 Kerrison to perform a thorough and complete intervertebral diskectomy.  The spinal canal was thoroughly decompressed, as was the right and left neuroforamen.  The endplates were then prepared and the appropriate-sized intervertebral spacer was then packed with ViviGen and tamped into position in the usual fashion. The Caspar pins  then were removed and bone wax was placed in their place.  The appropriate-sized anterior cervical plate was placed over the anterior spine.  14 mm variable angle screws were placed, 2 in each vertebral body from C6-C7 for a total of 4 vertebral body screws.  The screws were then locked to the plate using the Cam locking mechanism.  I was very pleased with the final fluoroscopic images.  The wound was then irrigated.  The wound was then explored for any undue bleeding and there was no bleeding noted. The wound was then closed in layers using 2-0 Vicryl, followed by 4-0 Monocryl.  Benzoin and Steri-Strips were applied, followed by sterile dressing.  All instrument counts were correct at the termination of the procedure.   Of note, Pricilla Holm, PA-C, was my assistant throughout surgery, and did aid in  retraction, suctioning, and closure from start to finish.         Jeffrey BambergMark Romulus Hanrahan, MD

## 2019-01-19 ENCOUNTER — Encounter (HOSPITAL_COMMUNITY): Payer: Self-pay | Admitting: Orthopedic Surgery

## 2019-01-19 DIAGNOSIS — M5412 Radiculopathy, cervical region: Secondary | ICD-10-CM | POA: Diagnosis not present

## 2019-01-19 MED ORDER — DIAZEPAM 5 MG PO TABS: 5.0000 mg | ORAL_TABLET | Freq: Four times a day (QID) | ORAL | 0 refills | Status: DC | PRN

## 2019-01-19 MED ORDER — OXYCODONE-ACETAMINOPHEN 5-325 MG PO TABS: 1.0000 | ORAL_TABLET | ORAL | 0 refills | Status: DC | PRN

## 2019-01-19 NOTE — Anesthesia Postprocedure Evaluation (Signed)
Anesthesia Post Note  Patient: Jeffrey Nash  Procedure(s) Performed: ANTERIOR CERVICAL DECOMPRESSION FUSION CERVICAL SIX-SEVEN WITH INSTRUMENTATION AND ALLOGRAFT (N/A Spine Cervical)     Patient location during evaluation: PACU Anesthesia Type: General Level of consciousness: awake and alert Pain management: pain level controlled Vital Signs Assessment: post-procedure vital signs reviewed and stable Respiratory status: spontaneous breathing, nonlabored ventilation, respiratory function stable and patient connected to nasal cannula oxygen Cardiovascular status: blood pressure returned to baseline and stable Postop Assessment: no apparent nausea or vomiting Anesthetic complications: no    Last Vitals:  Vitals:   01/18/19 2301 01/19/19 0428  BP: (!) 142/97 (!) 155/106  Pulse: 76 69  Resp: 20 20  Temp: 36.4 C 36.6 C  SpO2: 98% 97%    Last Pain:  Vitals:   01/19/19 0539  TempSrc:   PainSc: 3                  Anjoli Diemer

## 2019-01-19 NOTE — Plan of Care (Addendum)
Patient alert and oriented, mae's well, voiding adequate amount of urine, swallowing without difficulty, no c/o pain at time of discharge. Patient discharged home with family. Script and discharged instructions given to patient. Patient and family stated understanding of instructions given. Patient has an appointment with Dr. Dumonski 

## 2019-01-19 NOTE — Progress Notes (Signed)
    Patient doing well  Denies arm pain Tolerating PO well   Physical Exam: Vitals:   01/19/19 0428 01/19/19 0732  BP: (!) 155/106 (!) 145/102  Pulse: 69 71  Resp: 20 16  Temp: 97.8 F (36.6 C) 97.6 F (36.4 C)  SpO2: 97% 99%    Neck soft/supple Dressing in place NVI  POD #1 s/p ACDF, doing well  - encourage ambulation - Percocet for pain, Valium for muscle spasms - d/c home today with f/u in 2 weeks

## 2020-06-03 ENCOUNTER — Other Ambulatory Visit: Payer: Self-pay | Admitting: Orthopedic Surgery

## 2020-06-09 NOTE — Progress Notes (Signed)
[facility] - [place], [place] - [address] [place] [address] Phone: [phone] Fax: [phone]  [id number] [place], [place] - [address] AT [facility] [address] [place] [address] Phone: [phone] Fax: [phone]      Your procedure is scheduled on Wednesday January 19th.  Report to [facility] Main Entrance "A" at 10 A.M., and check in at the Admitting office.  Call this number if you have problems the morning of surgery:  [phone]  Call [phone] if you have any questions prior to your surgery date Monday-Friday 8am-4pm    Remember:  Do not eat after midnight the night before your surgery  You may drink clear liquids until 10 AM the morning of your surgery.   Clear liquids allowed are: Water, Non-Citrus Juices (without pulp), Carbonated Beverages, Clear Tea, Black Coffee Only, and Gatorade  Enhanced Recovery after Surgery for Orthopedics Enhanced Recovery after Surgery is a protocol used to improve the stress on your body and your recovery after surgery.  Patient Instructions  . The night before surgery:  o No food after midnight. ONLY clear liquids after midnight  .  Marland Kitchen The day of surgery (if you do NOT have diabetes):  o Drink ONE (1) Pre-Surgery Clear Ensure by _10_ am the morning of surgery   o This drink was given to you during your hospital  pre-op appointment visit. o Nothing else to drink after completing the  Pre-Surgery Clear Ensure.         If you have questions, please contact your surgeon's office.     Take these medicines the morning of surgery with A SIP OF WATER  IF NEEDED:   acetaminophen (TYLENOL) 500 MG tablet  cyclobenzaprine (FLEXERIL) 5 MG tablet  EPINEPHrine (EPIPEN 2-PAK) 0.3 mg/0.3 mL IJ SOAJ injection  As of today, STOP taking any Aspirin (unless otherwise instructed by your surgeon) Aleve, Naproxen, Ibuprofen,  Motrin, Advil, Goody's, BC's, all herbal medications, fish oil, and all vitamins.                  Do not wear jewelry            Do not wear lotions, powders, colognes, or deodorant.            Do not shave 48 hours prior to surgery.  Men may shave face and neck.            Do not bring valuables to the hospital.            [facility] is not responsible for any belongings or valuables.  Do NOT Smoke (Tobacco/Vaping) or drink Alcohol 24 hours prior to your procedure If you use a CPAP at night, you may bring all equipment for your overnight stay.   Contacts, glasses, dentures or bridgework may not be worn into surgery.      For patients admitted to the hospital, discharge time will be determined by your treatment team.   Patients discharged the day of surgery will not be allowed to drive home, and someone needs to stay with them for 24 hours.    Special instructions:   [facility]- Preparing For Surgery  Before surgery, you can play an important role. Because skin is not sterile, your skin needs to be as free of germs as possible. You can reduce the number of germs on your skin by washing with CHG (chlorahexidine  gluconate) Soap before surgery.  CHG is an antiseptic cleaner which kills germs and bonds with the skin to continue killing germs even after washing.    Oral Hygiene is also important to reduce your risk of infection.  Remember - BRUSH YOUR TEETH THE MORNING OF SURGERY WITH YOUR REGULAR TOOTHPASTE  Please do not use if you have an allergy to CHG or antibacterial soaps. If your skin becomes reddened/irritated stop using the CHG.  Do not shave (including legs and underarms) for at least 48 hours prior to first CHG shower. It is OK to shave your face.  Please follow these instructions carefully.   1. Shower the NIGHT BEFORE SURGERY and the MORNING OF SURGERY with CHG Soap.   2. If you chose to wash your hair, wash your hair first as usual with your normal shampoo.  3. After  you shampoo, rinse your hair and body thoroughly to remove the shampoo.  4. Use CHG as you would any other liquid soap. You can apply CHG directly to the skin and wash gently with a scrungie or a clean washcloth.   5. Apply the CHG Soap to your body ONLY FROM THE NECK DOWN.  Do not use on open wounds or open sores. Avoid contact with your eyes, ears, mouth and genitals (private parts). Wash Face and genitals (private parts)  with your normal soap.   6. Wash thoroughly, paying special attention to the area where your surgery will be performed.  7. Thoroughly rinse your body with warm water from the neck down.  8. DO NOT shower/wash with your normal soap after using and rinsing off the CHG Soap.  9. Pat yourself dry with a CLEAN TOWEL.  10. Wear CLEAN PAJAMAS to bed the night before surgery  11. Place CLEAN SHEETS on your bed the night of your first shower and DO NOT SLEEP WITH PETS.   Day of Surgery: Wear Clean/Comfortable clothing the morning of surgery Do not apply any deodorants/lotions.   Remember to brush your teeth WITH YOUR REGULAR TOOTHPASTE.   Please read over the following fact sheets that you were given.

## 2020-06-10 ENCOUNTER — Other Ambulatory Visit (HOSPITAL_COMMUNITY)
Admission: RE | Admit: 2020-06-10 | Discharge: 2020-06-10 | Disposition: A | Discharge status: Home / Self Care | Payer: No Typology Code available for payment source | Source: Ambulatory Visit | Attending: Orthopedic Surgery | Admitting: Orthopedic Surgery

## 2020-06-10 ENCOUNTER — Other Ambulatory Visit: Payer: Self-pay

## 2020-06-10 ENCOUNTER — Encounter (HOSPITAL_COMMUNITY)
Admission: RE | Admit: 2020-06-10 | Discharge: 2020-06-10 | Disposition: A | Discharge status: Home / Self Care | Payer: No Typology Code available for payment source | Source: Ambulatory Visit | Attending: Orthopedic Surgery | Admitting: Orthopedic Surgery

## 2020-06-10 ENCOUNTER — Encounter (HOSPITAL_COMMUNITY): Payer: Self-pay

## 2020-06-10 DIAGNOSIS — Z20822 Contact with and (suspected) exposure to covid-19: Secondary | ICD-10-CM | POA: Insufficient documentation

## 2020-06-10 DIAGNOSIS — Z01818 Encounter for other preprocedural examination: Secondary | ICD-10-CM | POA: Insufficient documentation

## 2020-06-10 HISTORY — DX: Unspecified osteoarthritis, unspecified site: M19.90

## 2020-06-10 HISTORY — DX: Headache, unspecified: R51.9

## 2020-06-10 HISTORY — DX: Ventral hernia without obstruction or gangrene: K43.9

## 2020-06-10 LAB — CBC WITH DIFFERENTIAL/PLATELET
Abs Immature Granulocytes: 0.07 10*3/uL (ref 0.00–0.07)
Basophils Absolute: 0.1 10*3/uL (ref 0.0–0.1)
Basophils Relative: 1 %
Eosinophils Absolute: 0.1 10*3/uL (ref 0.0–0.5)
Eosinophils Relative: 1 %
HCT: 48.9 % (ref 39.0–52.0)
Hemoglobin: 16.3 g/dL (ref 13.0–17.0)
Immature Granulocytes: 1 %
Lymphocytes Relative: 23 %
Lymphs Abs: 2.2 10*3/uL (ref 0.7–4.0)
MCH: 30.6 pg (ref 26.0–34.0)
MCHC: 33.3 g/dL (ref 30.0–36.0)
MCV: 91.9 fL (ref 80.0–100.0)
Monocytes Absolute: 0.6 10*3/uL (ref 0.1–1.0)
Monocytes Relative: 7 %
Neutro Abs: 6.5 10*3/uL (ref 1.7–7.7)
Neutrophils Relative %: 67 %
Platelets: 306 10*3/uL (ref 150–400)
RBC: 5.32 MIL/uL (ref 4.22–5.81)
RDW: 12.4 % (ref 11.5–15.5)
WBC: 9.6 10*3/uL (ref 4.0–10.5)
nRBC: 0 % (ref 0.0–0.2)

## 2020-06-10 LAB — URINALYSIS, ROUTINE W REFLEX MICROSCOPIC
Bilirubin Urine: NEGATIVE
Glucose, UA: NEGATIVE mg/dL
Ketones, ur: NEGATIVE mg/dL
Leukocytes,Ua: NEGATIVE
Nitrite: NEGATIVE
Protein, ur: NEGATIVE mg/dL
Specific Gravity, Urine: 1.019 (ref 1.005–1.030)
pH: 5 (ref 5.0–8.0)

## 2020-06-10 LAB — COMPREHENSIVE METABOLIC PANEL
ALT: 39 U/L (ref 0–44)
AST: 22 U/L (ref 15–41)
Albumin: 4.3 g/dL (ref 3.5–5.0)
Alkaline Phosphatase: 59 U/L (ref 38–126)
Anion gap: 12 (ref 5–15)
BUN: 15 mg/dL (ref 6–20)
CO2: 28 mmol/L (ref 22–32)
Calcium: 9.8 mg/dL (ref 8.9–10.3)
Chloride: 98 mmol/L (ref 98–111)
Creatinine, Ser: 0.91 mg/dL (ref 0.61–1.24)
GFR, Estimated: >60 mL/min (ref >60)
Glucose, Bld: 95 mg/dL (ref 70–99)
Potassium: 4.2 mmol/L (ref 3.5–5.1)
Sodium: 138 mmol/L (ref 135–145)
Total Bilirubin: 1.1 mg/dL (ref 0.3–1.2)
Total Protein: 8.3 g/dL — ABNORMAL HIGH (ref 6.5–8.1)

## 2020-06-10 LAB — SURGICAL PCR SCREEN
MRSA, PCR: NEGATIVE
Staphylococcus aureus: POSITIVE — AB

## 2020-06-10 LAB — SARS CORONAVIRUS 2 (TAT 6-24 HRS): SARS Coronavirus 2: NEGATIVE

## 2020-06-10 LAB — APTT: aPTT: 28 seconds (ref 24–36)

## 2020-06-10 LAB — TYPE AND SCREEN
ABO/RH(D): A NEG
Antibody Screen: NEGATIVE

## 2020-06-10 LAB — PROTIME-INR
INR: 1 (ref 0.8–1.2)
Prothrombin Time: 12.3 seconds (ref 11.4–15.2)

## 2020-06-10 MED ORDER — DEXTROSE 5 % IV SOLN
3.0000 g | INTRAVENOUS | Status: AC
  Administered 2020-06-11: 3 g via INTRAVENOUS
  Filled 2020-06-10: qty 3
  Filled 2020-06-10: qty 3000

## 2020-06-10 NOTE — Progress Notes (Addendum)
PCP - Dr. Maudry Diego- [facility]  Cardiologist - none  Chest x-ray - na  EKG - 06/10/20  Stress Test - 20189- CE Mr. Ragsdale had chest pain. Stress did not show any induced ischemia. It was felt that chest pain was from too much blood pressure medication.  ECHO - no  Cardiac Cath - no  Sleep Study - yes- 2017- CE CPAP - yes- does not wear, I encouraged him to begin wearing it and to wear it aft er surgery.  LABS-CBC with Diff, CMP, PTT, PT, T/S, UA, PCR.  ASA-na  ERAS-yes  HA1C-6.0- CE- 03/2020 Fasting Blood Sugar - na Checks Blood Sugar na times a day  Anesthesia-  Pt denies having chest pain, sob, or fever at this time. All instructions explained to the pt, with a verbal understanding of the material. Pt agrees to go over the instructions while at home for a better understanding. Pt also instructed to self quarantine after being tested for COVID-19. The opportunity to ask questions was provided. I called and spoke with Lupita Leash, Dr Mellody Drown said that Mr Dillion my arrive at 11:00  AM, I notified Mr. Kehoe

## 2020-06-11 ENCOUNTER — Ambulatory Visit (HOSPITAL_COMMUNITY): Payer: Worker's Compensation | Admitting: Physician Assistant

## 2020-06-11 ENCOUNTER — Other Ambulatory Visit: Payer: Self-pay

## 2020-06-11 ENCOUNTER — Encounter (HOSPITAL_COMMUNITY): Payer: Self-pay | Admitting: Orthopedic Surgery

## 2020-06-11 ENCOUNTER — Ambulatory Visit (HOSPITAL_COMMUNITY)
Admission: RE | Admit: 2020-06-11 | Discharge: 2020-06-11 | Disposition: A | Discharge status: Home / Self Care | Payer: Worker's Compensation | Attending: Orthopedic Surgery | Admitting: Orthopedic Surgery

## 2020-06-11 ENCOUNTER — Ambulatory Visit (HOSPITAL_COMMUNITY): Payer: No Typology Code available for payment source

## 2020-06-11 ENCOUNTER — Ambulatory Visit (HOSPITAL_COMMUNITY): Payer: Worker's Compensation | Admitting: Certified Registered Nurse Anesthetist

## 2020-06-11 ENCOUNTER — Ambulatory Visit (HOSPITAL_COMMUNITY)
Admission: RE | Disposition: A | Discharge status: Home / Self Care | Payer: Self-pay | Source: Home / Self Care | Attending: Orthopedic Surgery

## 2020-06-11 DIAGNOSIS — Z419 Encounter for procedure for purposes other than remedying health state, unspecified: Secondary | ICD-10-CM

## 2020-06-11 DIAGNOSIS — F17210 Nicotine dependence, cigarettes, uncomplicated: Secondary | ICD-10-CM | POA: Diagnosis not present

## 2020-06-11 DIAGNOSIS — Z01818 Encounter for other preprocedural examination: Secondary | ICD-10-CM | POA: Diagnosis not present

## 2020-06-11 DIAGNOSIS — F17290 Nicotine dependence, other tobacco product, uncomplicated: Secondary | ICD-10-CM | POA: Diagnosis not present

## 2020-06-11 DIAGNOSIS — M50122 Cervical disc disorder at C5-C6 level with radiculopathy: Secondary | ICD-10-CM | POA: Diagnosis not present

## 2020-06-11 DIAGNOSIS — M4802 Spinal stenosis, cervical region: Secondary | ICD-10-CM | POA: Diagnosis not present

## 2020-06-11 DIAGNOSIS — M5412 Radiculopathy, cervical region: Secondary | ICD-10-CM | POA: Diagnosis present

## 2020-06-11 HISTORY — PX: ANTERIOR CERVICAL DECOMP/DISCECTOMY FUSION: SHX1161

## 2020-06-11 SURGERY — ANTERIOR CERVICAL DECOMPRESSION/DISCECTOMY FUSION 1 LEVEL
Anesthesia: General | Site: Spine Cervical

## 2020-06-11 MED ORDER — ONDANSETRON HCL 4 MG/2ML IJ SOLN
INTRAMUSCULAR | Status: AC
  Filled 2020-06-11: qty 2

## 2020-06-11 MED ORDER — FENTANYL CITRATE (PF) 250 MCG/5ML IJ SOLN
INTRAMUSCULAR | Status: AC
  Filled 2020-06-11: qty 5

## 2020-06-11 MED ORDER — PROPOFOL 10 MG/ML IV BOLUS
INTRAVENOUS | Status: AC
  Filled 2020-06-11: qty 20

## 2020-06-11 MED ORDER — ORAL CARE MOUTH RINSE: 15.0000 mL | Freq: Once | OROMUCOSAL | Status: AC

## 2020-06-11 MED ORDER — PHENYLEPHRINE 40 MCG/ML (10ML) SYRINGE FOR IV PUSH (FOR BLOOD PRESSURE SUPPORT)
PREFILLED_SYRINGE | INTRAVENOUS | Status: AC
  Filled 2020-06-11: qty 10

## 2020-06-11 MED ORDER — ONDANSETRON HCL 4 MG/2ML IJ SOLN
INTRAMUSCULAR | Status: DC | PRN
  Administered 2020-06-11: 4 mg via INTRAVENOUS

## 2020-06-11 MED ORDER — LACTATED RINGERS IV SOLN: INTRAVENOUS | Status: DC

## 2020-06-11 MED ORDER — AMISULPRIDE (ANTIEMETIC) 5 MG/2ML IV SOLN: 10.0000 mg | Freq: Once | INTRAVENOUS | Status: AC

## 2020-06-11 MED ORDER — DEXMEDETOMIDINE (PRECEDEX) IN NS 20 MCG/5ML (4 MCG/ML) IV SYRINGE
PREFILLED_SYRINGE | INTRAVENOUS | Status: DC | PRN
  Administered 2020-06-11: 8 ug via INTRAVENOUS
  Administered 2020-06-11: 12 ug via INTRAVENOUS

## 2020-06-11 MED ORDER — SUCCINYLCHOLINE CHLORIDE 200 MG/10ML IV SOSY
PREFILLED_SYRINGE | INTRAVENOUS | Status: AC
  Filled 2020-06-11: qty 10

## 2020-06-11 MED ORDER — EPHEDRINE 5 MG/ML INJ
INTRAVENOUS | Status: AC
  Filled 2020-06-11: qty 10

## 2020-06-11 MED ORDER — BUPIVACAINE-EPINEPHRINE (PF) 0.25% -1:200000 IJ SOLN
INTRAMUSCULAR | Status: AC
  Filled 2020-06-11: qty 30

## 2020-06-11 MED ORDER — PHENYLEPHRINE HCL (PRESSORS) 10 MG/ML IV SOLN
INTRAVENOUS | Status: DC | PRN
  Administered 2020-06-11: 240 ug via INTRAVENOUS
  Administered 2020-06-11: 80 ug via INTRAVENOUS
  Administered 2020-06-11: 200 ug via INTRAVENOUS
  Administered 2020-06-11: 80 ug via INTRAVENOUS
  Administered 2020-06-11: 200 ug via INTRAVENOUS

## 2020-06-11 MED ORDER — MIDAZOLAM HCL 5 MG/5ML IJ SOLN
INTRAMUSCULAR | Status: DC | PRN
  Administered 2020-06-11: 2 mg via INTRAVENOUS

## 2020-06-11 MED ORDER — CHLORHEXIDINE GLUCONATE 0.12 % MT SOLN
15.0000 mL | Freq: Once | OROMUCOSAL | Status: AC
  Administered 2020-06-11: 15 mL via OROMUCOSAL
  Filled 2020-06-11: qty 15

## 2020-06-11 MED ORDER — LIDOCAINE 2% (20 MG/ML) 5 ML SYRINGE
INTRAMUSCULAR | Status: DC | PRN
  Administered 2020-06-11: 100 mg via INTRAVENOUS

## 2020-06-11 MED ORDER — DEXAMETHASONE SODIUM PHOSPHATE 10 MG/ML IJ SOLN
INTRAMUSCULAR | Status: DC | PRN
  Administered 2020-06-11: 8 mg via INTRAVENOUS

## 2020-06-11 MED ORDER — SUCCINYLCHOLINE CHLORIDE 20 MG/ML IJ SOLN
INTRAMUSCULAR | Status: DC | PRN
  Administered 2020-06-11: 200 mg via INTRAVENOUS

## 2020-06-11 MED ORDER — ROCURONIUM BROMIDE 10 MG/ML (PF) SYRINGE
PREFILLED_SYRINGE | INTRAVENOUS | Status: AC
  Filled 2020-06-11: qty 10

## 2020-06-11 MED ORDER — BUPIVACAINE-EPINEPHRINE 0.25% -1:200000 IJ SOLN
INTRAMUSCULAR | Status: DC | PRN
  Administered 2020-06-11: 5 mL

## 2020-06-11 MED ORDER — PHENYLEPHRINE HCL (PRESSORS) 10 MG/ML IV SOLN
INTRAVENOUS | Status: AC
  Filled 2020-06-11: qty 1

## 2020-06-11 MED ORDER — THROMBIN 20000 UNITS EX SOLR
CUTANEOUS | Status: DC | PRN
  Administered 2020-06-11: 20000 [IU] via TOPICAL

## 2020-06-11 MED ORDER — ROCURONIUM BROMIDE 10 MG/ML (PF) SYRINGE
PREFILLED_SYRINGE | INTRAVENOUS | Status: DC | PRN
  Administered 2020-06-11: 70 mg via INTRAVENOUS

## 2020-06-11 MED ORDER — PHENYLEPHRINE 40 MCG/ML (10ML) SYRINGE FOR IV PUSH (FOR BLOOD PRESSURE SUPPORT)
PREFILLED_SYRINGE | INTRAVENOUS | Status: AC
  Filled 2020-06-11: qty 20

## 2020-06-11 MED ORDER — MIDAZOLAM HCL 2 MG/2ML IJ SOLN
INTRAMUSCULAR | Status: AC
  Filled 2020-06-11: qty 2

## 2020-06-11 MED ORDER — FENTANYL CITRATE (PF) 100 MCG/2ML IJ SOLN
INTRAMUSCULAR | Status: AC
  Administered 2020-06-11: 50 ug via INTRAVENOUS
  Filled 2020-06-11: qty 2

## 2020-06-11 MED ORDER — POVIDONE-IODINE 7.5 % EX SOLN
Freq: Once | CUTANEOUS | Status: DC
  Filled 2020-06-11: qty 118

## 2020-06-11 MED ORDER — METHOCARBAMOL 500 MG PO TABS: 500.0000 mg | ORAL_TABLET | Freq: Four times a day (QID) | ORAL | 0 refills | Status: DC | PRN

## 2020-06-11 MED ORDER — LIDOCAINE 2% (20 MG/ML) 5 ML SYRINGE
INTRAMUSCULAR | Status: AC
  Filled 2020-06-11: qty 5

## 2020-06-11 MED ORDER — THROMBIN 20000 UNITS EX KIT
PACK | CUTANEOUS | Status: AC
  Filled 2020-06-11: qty 1

## 2020-06-11 MED ORDER — PHENYLEPHRINE HCL-NACL 10-0.9 MG/250ML-% IV SOLN
INTRAVENOUS | Status: DC | PRN
  Administered 2020-06-11: 75 ug/min via INTRAVENOUS

## 2020-06-11 MED ORDER — FENTANYL CITRATE (PF) 100 MCG/2ML IJ SOLN
25.0000 ug | INTRAMUSCULAR | Status: DC | PRN
Start: 2020-06-11 — End: 2020-06-11
  Administered 2020-06-11: 50 ug via INTRAVENOUS

## 2020-06-11 MED ORDER — PROPOFOL 10 MG/ML IV BOLUS
INTRAVENOUS | Status: DC | PRN
  Administered 2020-06-11: 200 mg via INTRAVENOUS

## 2020-06-11 MED ORDER — SUGAMMADEX SODIUM 200 MG/2ML IV SOLN
INTRAVENOUS | Status: DC | PRN
  Administered 2020-06-11: 350 mg via INTRAVENOUS

## 2020-06-11 MED ORDER — AMISULPRIDE (ANTIEMETIC) 5 MG/2ML IV SOLN
INTRAVENOUS | Status: AC
  Administered 2020-06-11: 10 mg via INTRAVENOUS
  Filled 2020-06-11: qty 4

## 2020-06-11 MED ORDER — FENTANYL CITRATE (PF) 100 MCG/2ML IJ SOLN
INTRAMUSCULAR | Status: DC | PRN
  Administered 2020-06-11: 50 ug via INTRAVENOUS
  Administered 2020-06-11: 100 ug via INTRAVENOUS
  Administered 2020-06-11 (×3): 50 ug via INTRAVENOUS

## 2020-06-11 MED ORDER — OXYCODONE-ACETAMINOPHEN 5-325 MG PO TABS: 1.0000 | ORAL_TABLET | ORAL | 0 refills | Status: AC | PRN

## 2020-06-11 SURGICAL SUPPLY — 71 items
BENZOIN TINCTURE PRP APPL 2/3 (GAUZE/BANDAGES/DRESSINGS) ×2 IMPLANT
BIT DRILL NEURO 2X3.1 SFT TUCH (MISCELLANEOUS) ×1 IMPLANT
BIT DRILL SRG 14X2.2XFLT CHK (BIT) ×1 IMPLANT
BIT DRL SRG 14X2.2XFLT CHK (BIT) ×1
BLADE CLIPPER SURG (BLADE) ×2 IMPLANT
BLADE SURG 15 STRL LF DISP TIS (BLADE) ×1 IMPLANT
BLADE SURG 15 STRL SS (BLADE) ×1
BONE VIVIGEN FORMABLE 1.3CC (Bone Implant) ×2 IMPLANT
CARTRIDGE OIL MAESTRO DRILL (MISCELLANEOUS) ×1 IMPLANT
CORD BIPOLAR FORCEPS 12FT (ELECTRODE) ×2 IMPLANT
COVER SURGICAL LIGHT HANDLE (MISCELLANEOUS) ×2 IMPLANT
COVER WAND RF STERILE (DRAPES) ×2 IMPLANT
DIFFUSER DRILL AIR PNEUMATIC (MISCELLANEOUS) ×2 IMPLANT
DRAIN JACKSON RD 7FR 3/32 (WOUND CARE) IMPLANT
DRAPE C-ARM 42X72 X-RAY (DRAPES) ×2 IMPLANT
DRAPE POUCH INSTRU U-SHP 10X18 (DRAPES) ×2 IMPLANT
DRAPE SURG 17X23 STRL (DRAPES) ×8 IMPLANT
DRILL BIT SKYLINE 14MM (BIT) ×1
DRILL NEURO 2X3.1 SOFT TOUCH (MISCELLANEOUS) ×2
DURAPREP 26ML APPLICATOR (WOUND CARE) ×2 IMPLANT
ELECT COATED BLADE 2.86 ST (ELECTRODE) ×2 IMPLANT
ELECT REM PT RETURN 9FT ADLT (ELECTROSURGICAL) ×2
ELECTRODE REM PT RTRN 9FT ADLT (ELECTROSURGICAL) ×1 IMPLANT
EVACUATOR SILICONE 100CC (DRAIN) IMPLANT
GAUZE 4X4 16PLY RFD (DISPOSABLE) ×2 IMPLANT
GAUZE SPONGE 4X4 12PLY STRL (GAUZE/BANDAGES/DRESSINGS) ×2 IMPLANT
GLOVE BIO SURGEON STRL SZ7 (GLOVE) ×2 IMPLANT
GLOVE BIO SURGEON STRL SZ8 (GLOVE) ×2 IMPLANT
GLOVE BIOGEL PI IND STRL 7.0 (GLOVE) ×2 IMPLANT
GLOVE BIOGEL PI INDICATOR 7.0 (GLOVE) ×2
GLOVE SRG 8 PF TXTR STRL LF DI (GLOVE) ×1 IMPLANT
GLOVE SURG UNDER POLY LF SZ8 (GLOVE) ×1
GOWN STRL REUS W/ TWL LRG LVL3 (GOWN DISPOSABLE) ×2 IMPLANT
GOWN STRL REUS W/ TWL XL LVL3 (GOWN DISPOSABLE) ×1 IMPLANT
GOWN STRL REUS W/TWL LRG LVL3 (GOWN DISPOSABLE) ×2
GOWN STRL REUS W/TWL XL LVL3 (GOWN DISPOSABLE) ×1
INTERLOCK LRDTC CRVCL VBR 8MM (Peek) ×1 IMPLANT
IV CATH 14GX2 1/4 (CATHETERS) ×2 IMPLANT
KIT BASIN OR (CUSTOM PROCEDURE TRAY) ×2 IMPLANT
KIT TURNOVER KIT B (KITS) ×2 IMPLANT
LORDOTIC CERVICAL VBR 8MM SM (Peek) ×2 IMPLANT
MANIFOLD NEPTUNE II (INSTRUMENTS) ×2 IMPLANT
NEEDLE PRECISIONGLIDE 27X1.5 (NEEDLE) ×2 IMPLANT
NEEDLE SPNL 20GX3.5 QUINCKE YW (NEEDLE) ×2 IMPLANT
NS IRRIG 1000ML POUR BTL (IV SOLUTION) ×2 IMPLANT
OIL CARTRIDGE MAESTRO DRILL (MISCELLANEOUS) ×2
PACK ORTHO CERVICAL (CUSTOM PROCEDURE TRAY) ×2 IMPLANT
PAD ARMBOARD 7.5X6 YLW CONV (MISCELLANEOUS) ×4 IMPLANT
PATTIES SURGICAL .5 X.5 (GAUZE/BANDAGES/DRESSINGS) ×2 IMPLANT
PATTIES SURGICAL .5 X1 (DISPOSABLE) ×2 IMPLANT
PIN DISTRACTION 14 (PIN) ×4 IMPLANT
PLATE SKYLINE 12MM (Plate) ×2 IMPLANT
POSITIONER HEAD DONUT 9IN (MISCELLANEOUS) ×2 IMPLANT
SCREW SKYLINE VAR OS 14MM (Screw) ×6 IMPLANT
SCREW SKYLINE VARIABLE LG (Screw) ×2 IMPLANT
SPONGE INTESTINAL PEANUT (DISPOSABLE) ×2 IMPLANT
SPONGE SURGIFOAM ABS GEL 100 (HEMOSTASIS) ×2 IMPLANT
STRIP CLOSURE SKIN 1/2X4 (GAUZE/BANDAGES/DRESSINGS) ×2 IMPLANT
SURGIFLO W/THROMBIN 8M KIT (HEMOSTASIS) IMPLANT
SUT MNCRL AB 4-0 PS2 18 (SUTURE) ×2 IMPLANT
SUT SILK 4 0 (SUTURE)
SUT SILK 4-0 18XBRD TIE 12 (SUTURE) IMPLANT
SUT VIC AB 2-0 CT2 18 VCP726D (SUTURE) ×2 IMPLANT
SYR BULB IRRIG 60ML STRL (SYRINGE) ×4 IMPLANT
SYR CONTROL 10ML LL (SYRINGE) ×4 IMPLANT
TAPE CLOTH 4X10 WHT NS (GAUZE/BANDAGES/DRESSINGS) ×2 IMPLANT
TAPE UMBILICAL COTTON 1/8X30 (MISCELLANEOUS) ×2 IMPLANT
TOWEL GREEN STERILE (TOWEL DISPOSABLE) ×2 IMPLANT
TOWEL GREEN STERILE FF (TOWEL DISPOSABLE) ×2 IMPLANT
WATER STERILE IRR 1000ML POUR (IV SOLUTION) ×2 IMPLANT
YANKAUER SUCT BULB TIP NO VENT (SUCTIONS) ×2 IMPLANT

## 2020-06-11 NOTE — Anesthesia Postprocedure Evaluation (Signed)
Anesthesia Post Note  Patient: Jeffrey Nash  Procedure(s) Performed: ANTERIOR CERVICAL DECOMPRESSION FUSION CERVICAL 5- CERVICAL 6 WITH INSTRUMENTATION AND ALLOGRAFT (N/A Spine Cervical)     Patient location during evaluation: PACU Anesthesia Type: General Level of consciousness: awake and alert Pain management: pain level controlled Vital Signs Assessment: post-procedure vital signs reviewed and stable Respiratory status: spontaneous breathing, nonlabored ventilation and respiratory function stable Cardiovascular status: blood pressure returned to baseline and stable Postop Assessment: no apparent nausea or vomiting Anesthetic complications: no   No complications documented.  Last Vitals:  Vitals:   06/11/20 1500 06/11/20 1515  BP: 110/80 112/82  Pulse: 85 92  Resp: 12 15  Temp:    SpO2: 91% 94%    Last Pain:  Vitals:   06/11/20 1515  TempSrc:   PainSc: 4                  Lucretia Kern

## 2020-06-11 NOTE — H&P (Signed)
PREOPERATIVE H&P  Chief Complaint: Right > left arm pain  HPI: Jeffrey Nash is a 55 y.o. male who presents with ongoing pain in the bilateral arms  MRI reveals a disc herniation at C5/6 compressing the spinal cord and left NF   Patient has failed multiple forms of conservative care and continues to have pain (see office notes for additional details regarding the patient's full course of treatment)  Past Medical History:  Diagnosis Date  . Arthritis   . Cervical radiculopathy   . Headache    migraines  . Hernia of abdominal wall   . Hypertension   . Nerve pain   . Sleep apnea    Past Surgical History:  Procedure Laterality Date  . ANTERIOR CERVICAL DECOMP/DISCECTOMY FUSION N/A 01/18/2019   Procedure: ANTERIOR CERVICAL DECOMPRESSION FUSION CERVICAL SIX-SEVEN WITH INSTRUMENTATION AND ALLOGRAFT;  Surgeon: Estill Bamberg, MD;  Location: [facility] OR;  Service: Orthopedics;  Laterality: N/A;  . APPENDECTOMY    . BACK SURGERY    . CERVICAL SPINE SURGERY    . COLONOSCOPY    . LUMBAR DISC SURGERY    . NASAL ENDOSCOPY    . POSTERIOR FUSION LUMBAR SPINE    . SEPTOPLASTY WITH TURBINATE REDUCTION RHINOPLASTY     . SHOULDER ARTHROSCOPY    . TONSILLECTOMY     Social History   Socioeconomic History  . Marital status: Single    Spouse name: Not on file  . Number of children: Not on file  . Years of education: Not on file  . Highest education level: Not on file  Occupational History  . Not on file  Tobacco Use  . Smoking status: Current Every Day Smoker    Packs/day: 0.25    Types: Pipe, Cigarettes  . Smokeless tobacco: Former Neurosurgeon    Types: Snuff, Chew  Vaping Use  . Vaping Use: Never used  Substance and Sexual Activity  . Alcohol use: Yes    Comment: rarely once a month  . Drug use: Not Currently  . Sexual activity: Not on file  Other Topics Concern  . Not on file  Social History Narrative  . Not on file   Social Determinants of Health   Financial Resource Strain:  Not on file  Food Insecurity: Not on file  Transportation Needs: Not on file  Physical Activity: Not on file  Stress: Not on file  Social Connections: Not on file   No family history on file. Allergies  Allergen Reactions  . Bee Venom Anaphylaxis and Swelling   Prior to Admission medications   Medication Sig Start Date End Date Taking? Authorizing Provider  acetaminophen (TYLENOL) 500 MG tablet Take 1,000 mg by mouth every 6 (six) hours as needed for moderate pain.   Yes [provider]  cyclobenzaprine (FLEXERIL) 5 MG tablet Take 5 mg by mouth 2 (two) times daily as needed for muscle spasms. 04/08/20  Yes [provider]  EPINEPHrine (EPIPEN 2-PAK) 0.3 mg/0.3 mL IJ SOAJ injection Inject 0.3 mg into the muscle as needed for anaphylaxis.   Yes [provider]  ibuprofen (ADVIL) 200 MG tablet Take 400 mg by mouth every 6 (six) hours as needed for moderate pain.   Yes [provider]  lisinopril (ZESTRIL) 10 MG tablet Take 10 mg by mouth daily.   Yes [provider]     All other systems have been reviewed and were otherwise negative with the exception of those mentioned in the HPI and as above.  Physical Exam: There were no vitals filed for this visit.  There is no height or weight on file to calculate BMI.  General: Alert, no acute distress Cardiovascular: No pedal edema Respiratory: No cyanosis, no use of accessory musculature Skin: No lesions in the area of chief complaint Neurologic: Sensation intact distally Psychiatric: Patient is competent for consent with normal mood and affect Lymphatic: No axillary or cervical lymphadenopathy   Assessment/Plan: CERVICAL MYELOPATHY WITH CERVICAL RADICULOPATHY Plan for Procedure(s): ANTERIOR CERVICAL DECOMPRESSION FUSION CERVICAL 5- CERVICAL 6 WITH INSTRUMENTATION AND ALLOGRAFT   Jackelyn Hoehn, MD 06/11/2020 8:55 AM

## 2020-06-11 NOTE — Transfer of Care (Signed)
Immediate Anesthesia Transfer of Care Note  Patient: Jeffrey Nash  Procedure(s) Performed: ANTERIOR CERVICAL DECOMPRESSION FUSION CERVICAL 5- CERVICAL 6 WITH INSTRUMENTATION AND ALLOGRAFT (N/A Spine Cervical)  Patient Location: PACU  Anesthesia Type:General  Level of Consciousness: awake and alert   Airway & Oxygen Therapy: Patient Spontanous Breathing and Patient connected to nasal cannula oxygen  Post-op Assessment: Report given to RN and Post -op Vital signs reviewed and stable  Post vital signs: Reviewed and stable  Last Vitals:  Vitals Value Taken Time  BP 151/82 06/11/20 1430  Temp 37 C 06/11/20 1430  Pulse 95 06/11/20 1434  Resp 15 06/11/20 1434  SpO2 93 % 06/11/20 1434  Vitals shown include unvalidated device data.  Last Pain:  Vitals:   06/11/20 1430  TempSrc:   PainSc: 7          Complications: No complications documented.

## 2020-06-11 NOTE — Anesthesia Preprocedure Evaluation (Addendum)
Anesthesia Evaluation  Patient identified by MRN, date of birth, ID band Patient awake    Reviewed: Allergy & Precautions, NPO status , Patient's Chart, lab work & pertinent test results  Airway Mallampati: II  TM Distance: >3 FB     Dental   Pulmonary sleep apnea , Current Smoker and Patient abstained from smoking.,    breath sounds clear to auscultation       Cardiovascular hypertension,  Rhythm:Regular Rate:Normal     Neuro/Psych  Headaches,  Neuromuscular disease    GI/Hepatic negative GI ROS,   Endo/Other  negative endocrine ROS  Renal/GU negative Renal ROS     Musculoskeletal  (+) Arthritis ,   Abdominal   Peds  Hematology   Anesthesia Other Findings   Reproductive/Obstetrics                             Anesthesia Physical Anesthesia Plan  ASA: III  Anesthesia Plan: General   Post-op Pain Management:    Induction: Intravenous  PONV Risk Score and Plan: 2 and Ondansetron, Dexamethasone and Midazolam  Airway Management Planned: Oral ETT  Additional Equipment:   Intra-op Plan:   Post-operative Plan: Possible Post-op intubation/ventilation  Informed Consent: I have reviewed the patients History and Physical, chart, labs and discussed the procedure including the risks, benefits and alternatives for the proposed anesthesia with the patient or authorized representative who has indicated his/her understanding and acceptance.     Dental advisory given  Plan Discussed with: CRNA and Anesthesiologist  Anesthesia Plan Comments:         Anesthesia Quick Evaluation

## 2020-06-11 NOTE — Op Note (Signed)
PATIENT NAME: Jeffrey Nash   MEDICAL RECORD NO.:   [id number]    DATE OF BIRTH: 1965-09-26   DATE OF PROCEDURE: 06/11/2020                               OPERATIVE REPORT     PREOPERATIVE DIAGNOSES: 1. Bilateral cervical radiculopathy. 2. 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 Bilateral cervical radiculopathy. 2. 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 Anterior cervical decompression and fusion C5/6 2. Placement of anterior instrumentation, C5/6 3. Insertion of interbody device x1 (66mm Titan intervertebral spacer). 4. Intraoperative use of fluoroscopy. 5. Use of morselized allograft - ViviGen.   SURGEON:  Estill Bamberg, MD   ASSISTANT:  Jason Coop, PA-C.   ANESTHESIA:  General endotracheal anesthesia.   COMPLICATIONS:  None.   DISPOSITION:  Stable.   ESTIMATED BLOOD LOSS:  Minimal.   INDICATIONS FOR SURGERY:  Briefly, Mr. Eckert is a pleasant 55 -year- old male, who did present to me with severe pain in his neck and bilateral arms.   Of note, he is status post an ACDF at C4-C5, and also at C6-C7. His most recent MRI did reveal a disc protrusion at C5-C6, compressing the spinal cord and left foramen. Given his ongoing pain and lack of improvement with appropriate treatment measures, we did discuss proceeding with the procedure noted above.  The patient was fully aware of the risks and limitations of surgery as outlined in my preoperative note.   OPERATIVE DETAILS:  On 06/11/2020, the patient was brought to surgery and general endotracheal anesthesia was administered.  The patient was placed supine on the hospital bed. The neck was gently extended.  All bony prominences were meticulously padded.  The neck was prepped and draped in the usual sterile fashion.  At this point, I did make a left-sided transverse incision.  The platysma was incised.  A Smith-Robinson approach was used and the anterior spine was identified. A self-retaining retractor was placed.   I then subperiosteally exposed the vertebral bodies from C5-C6.  Caspar pins were then placed into the C5 and C6 vertebral bodies and distraction was applied.  A thorough and complete C5-6 intervertebral diskectomy was performed.  The posterior longitudinal ligament was identified and entered using a nerve hook.  I then used #1 followed by #2 Kerrison to perform a thorough and complete intervertebral diskectomy.  The spinal canal was thoroughly decompressed, as was the left and right neuroforamena.  The endplates were then prepared and the appropriate-sized intervertebral spacer was then packed with ViviGen and tamped into position in the usual fashion. The Caspar pins  then were removed and bone wax was placed in their place.  The appropriate-sized anterior cervical plate was placed over the anterior spine.  14 mm variable angle screws were placed, 2 in each vertebral body from C5-C6 for a total of 4 vertebral body screws.  The screws were then locked to the plate using the Cam locking mechanism.  I was very pleased with the final fluoroscopic images.  The wound was then irrigated.  The wound was then explored for any undue bleeding and there was no bleeding noted. The wound was then closed in layers using 2-0 Vicryl, followed by 4-0 Monocryl.  Benzoin and Steri-Strips were applied, followed by sterile dressing.  All instrument counts were correct at the termination of the procedure.   Of note, Jason Coop, PA-C, was my assistant throughout surgery, and  did aid in retraction, placement of the hardware, suctioning, and closure from start to finish.         Estill Bamberg, MD

## 2020-06-11 NOTE — OR Nursing (Signed)
Pt is awake,alert and oriented. Pt is in NAD at this time. Pt and family verbalized understanding of poc and discharge instructions. instructions given to family and reviewed prior to discharge.Pt is ambulatory to wheelchair with stand by assist but not needed with steady gait. Pt cervical collar in place, no hematoma or bleeding noted, pt able to handle secretions, food/drink completed in PACU without difficulty.

## 2020-06-11 NOTE — Anesthesia Procedure Notes (Addendum)
Procedure Name: Intubation Date/Time: 06/11/2020 12:22 PM Performed by: Epifanio Lesches, CRNA Pre-anesthesia Checklist: Patient identified, Emergency Drugs available, Suction available and Patient being monitored Patient Re-evaluated:Patient Re-evaluated prior to induction Oxygen Delivery Method: Circle System Utilized Preoxygenation: Pre-oxygenation with 100% oxygen Induction Type: IV induction Ventilation: Mask ventilation without difficulty Laryngoscope Size: Glidescope and 4 Grade View: Grade I Tube type: Oral Tube size: 7.5 mm Number of attempts: 1 Airway Equipment and Method: Stylet and Oral airway Placement Confirmation: ETT inserted through vocal cords under direct vision,  positive ETCO2 and breath sounds checked- equal and bilateral Secured at: 23 cm Tube secured with: Tape Dental Injury: Teeth and Oropharynx as per pre-operative assessment  Comments: Elective glidescope intubation

## 2020-06-12 MED FILL — Thrombin For Soln Kit 20000 Unit: CUTANEOUS | Qty: 1 | Status: AC

## 2020-06-15 ENCOUNTER — Encounter (HOSPITAL_COMMUNITY): Payer: Self-pay | Admitting: Orthopedic Surgery

## 2021-03-24 DIAGNOSIS — J189 Pneumonia, unspecified organism: Secondary | ICD-10-CM

## 2021-03-24 HISTORY — DX: Pneumonia, unspecified organism: J18.9

## 2021-06-05 ENCOUNTER — Other Ambulatory Visit: Payer: Self-pay | Admitting: Orthopedic Surgery

## 2021-06-09 ENCOUNTER — Other Ambulatory Visit: Payer: Self-pay

## 2021-06-09 ENCOUNTER — Encounter (HOSPITAL_COMMUNITY): Payer: Self-pay | Admitting: Orthopedic Surgery

## 2021-06-09 NOTE — Progress Notes (Addendum)
[name] denies chest pain or shortness of breath. Patient denies having any s/s of Covid in his household.  Patient denies any known exposure to Covid.  PCP - Dr. Maudry Diego- [facility] [place]   Cardiologist - none   Sleep Study - yes- 2017- CPAP - has been wearing recently, I asked patient to bring the mask with him.   I instructed [name] to shower with antibiotic soap, if it is available.  Dry off with a clean towel. Do not put lotion, powder, cologne or deodorant or makeup.No jewelry or piercings. Men may shave their face and neck. Woman should not shave. No nail polish, artificial or acrylic nails. Wear clean clothes, brush your teeth. Glasses, contact lens,dentures or partials may not be worn in the OR. If you need to wear them, please bring a case for glasses, do not wear contacts or bring a case, the hospital does not have contact cases, dentures or partials will have to be removed , make sure they are clean, we will provide a denture cup to put them in. You will need some one to drive you home and a responsible person over the age of 51 to stay with you for the first 24 hours after surgery.

## 2021-06-10 ENCOUNTER — Ambulatory Visit (HOSPITAL_COMMUNITY): Payer: No Typology Code available for payment source | Attending: Orthopedic Surgery

## 2021-06-10 ENCOUNTER — Inpatient Hospital Stay (HOSPITAL_COMMUNITY)
Admission: RE | Admit: 2021-06-10 | Discharge: 2021-06-11 | DRG: 030 | Disposition: A | Discharge status: Home / Self Care | Payer: No Typology Code available for payment source | Attending: Orthopedic Surgery | Admitting: Orthopedic Surgery

## 2021-06-10 ENCOUNTER — Ambulatory Visit: Payer: Self-pay

## 2021-06-10 ENCOUNTER — Ambulatory Visit (HOSPITAL_COMMUNITY): Payer: No Typology Code available for payment source

## 2021-06-10 ENCOUNTER — Ambulatory Visit (HOSPITAL_COMMUNITY): Payer: No Typology Code available for payment source | Admitting: Anesthesiology

## 2021-06-10 ENCOUNTER — Encounter (HOSPITAL_COMMUNITY)
Admission: RE | Disposition: A | Discharge status: Home / Self Care | Payer: Self-pay | Source: Home / Self Care | Attending: Orthopedic Surgery

## 2021-06-10 ENCOUNTER — Encounter (HOSPITAL_COMMUNITY): Payer: Self-pay | Admitting: Orthopedic Surgery

## 2021-06-10 DIAGNOSIS — M542 Cervicalgia: Secondary | ICD-10-CM | POA: Diagnosis present

## 2021-06-10 DIAGNOSIS — Z419 Encounter for procedure for purposes other than remedying health state, unspecified: Secondary | ICD-10-CM

## 2021-06-10 DIAGNOSIS — Z91030 Bee allergy status: Secondary | ICD-10-CM

## 2021-06-10 DIAGNOSIS — Z981 Arthrodesis status: Secondary | ICD-10-CM | POA: Insufficient documentation

## 2021-06-10 DIAGNOSIS — M5412 Radiculopathy, cervical region: Principal | ICD-10-CM | POA: Diagnosis present

## 2021-06-10 DIAGNOSIS — M5410 Radiculopathy, site unspecified: Secondary | ICD-10-CM | POA: Diagnosis present

## 2021-06-10 DIAGNOSIS — Z20822 Contact with and (suspected) exposure to covid-19: Secondary | ICD-10-CM | POA: Diagnosis present

## 2021-06-10 DIAGNOSIS — F17210 Nicotine dependence, cigarettes, uncomplicated: Secondary | ICD-10-CM | POA: Diagnosis present

## 2021-06-10 DIAGNOSIS — Z79899 Other long term (current) drug therapy: Secondary | ICD-10-CM

## 2021-06-10 DIAGNOSIS — I10 Essential (primary) hypertension: Secondary | ICD-10-CM | POA: Diagnosis present

## 2021-06-10 HISTORY — PX: POSTERIOR CERVICAL FUSION/FORAMINOTOMY: SHX5038

## 2021-06-10 LAB — SURGICAL PCR SCREEN
MRSA, PCR: NEGATIVE
Staphylococcus aureus: POSITIVE — AB

## 2021-06-10 LAB — POCT I-STAT, CHEM 8
BUN: 13 mg/dL (ref 6–20)
Calcium, Ion: 1.15 mmol/L (ref 1.15–1.40)
Chloride: 101 mmol/L (ref 98–111)
Creatinine, Ser: 0.7 mg/dL (ref 0.61–1.24)
Glucose, Bld: 118 mg/dL — ABNORMAL HIGH (ref 70–99)
HCT: 50 % (ref 39.0–52.0)
Hemoglobin: 17 g/dL (ref 13.0–17.0)
Potassium: 4.2 mmol/L (ref 3.5–5.1)
Sodium: 138 mmol/L (ref 135–145)
TCO2: 28 mmol/L (ref 22–32)

## 2021-06-10 LAB — TYPE AND SCREEN
ABO/RH(D): A NEG
Antibody Screen: NEGATIVE

## 2021-06-10 LAB — SARS CORONAVIRUS 2 (TAT 6-24 HRS): SARS Coronavirus 2: NEGATIVE

## 2021-06-10 LAB — SARS CORONAVIRUS 2 BY RT PCR (HOSPITAL ORDER, PERFORMED IN ~~LOC~~ HOSPITAL LAB): SARS Coronavirus 2: NEGATIVE

## 2021-06-10 SURGERY — POSTERIOR CERVICAL FUSION/FORAMINOTOMY LEVEL 4
Anesthesia: General

## 2021-06-10 MED ORDER — CHLORHEXIDINE GLUCONATE CLOTH 2 % EX PADS
6.0000 | MEDICATED_PAD | Freq: Every day | CUTANEOUS | Status: DC
  Administered 2021-06-11: 6 via TOPICAL

## 2021-06-10 MED ORDER — ONDANSETRON HCL 4 MG/2ML IJ SOLN
INTRAMUSCULAR | Status: AC
  Filled 2021-06-10: qty 2

## 2021-06-10 MED ORDER — ROCURONIUM BROMIDE 10 MG/ML (PF) SYRINGE
PREFILLED_SYRINGE | INTRAVENOUS | Status: DC | PRN
Start: 2021-06-10 — End: 2021-06-10
  Administered 2021-06-10: 30 mg via INTRAVENOUS
  Administered 2021-06-10: 20 mg via INTRAVENOUS
  Administered 2021-06-10: 50 mg via INTRAVENOUS
  Administered 2021-06-10: 20 mg via INTRAVENOUS
  Administered 2021-06-10: 70 mg via INTRAVENOUS
  Administered 2021-06-10: 20 mg via INTRAVENOUS

## 2021-06-10 MED ORDER — MIDAZOLAM HCL 2 MG/2ML IJ SOLN
INTRAMUSCULAR | Status: DC | PRN
  Administered 2021-06-10: 2 mg via INTRAVENOUS

## 2021-06-10 MED ORDER — ROCURONIUM BROMIDE 10 MG/ML (PF) SYRINGE
PREFILLED_SYRINGE | INTRAVENOUS | Status: AC
  Filled 2021-06-10: qty 10

## 2021-06-10 MED ORDER — LISINOPRIL 20 MG PO TABS
20.0000 mg | ORAL_TABLET | Freq: Every day | ORAL | Status: DC
Start: 2021-06-11 — End: 2021-06-11
  Administered 2021-06-11: 20 mg via ORAL
  Filled 2021-06-10: qty 1

## 2021-06-10 MED ORDER — FLEET ENEMA 7-19 GM/118ML RE ENEM: 1.0000 | ENEMA | Freq: Once | RECTAL | Status: DC | PRN

## 2021-06-10 MED ORDER — DEXMEDETOMIDINE (PRECEDEX) IN NS 20 MCG/5ML (4 MCG/ML) IV SYRINGE
PREFILLED_SYRINGE | INTRAVENOUS | Status: DC | PRN
  Administered 2021-06-10: 4 ug via INTRAVENOUS
  Administered 2021-06-10 (×2): 8 ug via INTRAVENOUS

## 2021-06-10 MED ORDER — SODIUM CHLORIDE 0.9 % IV SOLN: 250.0000 mL | INTRAVENOUS | Status: DC

## 2021-06-10 MED ORDER — CHLORHEXIDINE GLUCONATE 0.12 % MT SOLN: 15.0000 mL | Freq: Once | OROMUCOSAL | Status: AC

## 2021-06-10 MED ORDER — HYDROMORPHONE HCL 1 MG/ML IJ SOLN
INTRAMUSCULAR | Status: DC | PRN
Start: 2021-06-10 — End: 2021-06-10
  Administered 2021-06-10: .5 mg via INTRAVENOUS

## 2021-06-10 MED ORDER — KETAMINE HCL 10 MG/ML IJ SOLN
INTRAMUSCULAR | Status: DC | PRN
  Administered 2021-06-10: 50 mg via INTRAVENOUS

## 2021-06-10 MED ORDER — BUPIVACAINE-EPINEPHRINE (PF) 0.25% -1:200000 IJ SOLN
INTRAMUSCULAR | Status: AC
  Filled 2021-06-10: qty 30

## 2021-06-10 MED ORDER — CHLORHEXIDINE GLUCONATE 0.12 % MT SOLN
OROMUCOSAL | Status: AC
  Administered 2021-06-10: 15 mL via OROMUCOSAL
  Filled 2021-06-10: qty 15

## 2021-06-10 MED ORDER — THROMBIN 20000 UNITS EX KIT
PACK | CUTANEOUS | Status: AC
  Filled 2021-06-10: qty 1

## 2021-06-10 MED ORDER — HYDROMORPHONE HCL 1 MG/ML IJ SOLN
INTRAMUSCULAR | Status: AC
  Filled 2021-06-10: qty 1

## 2021-06-10 MED ORDER — LIDOCAINE 2% (20 MG/ML) 5 ML SYRINGE
INTRAMUSCULAR | Status: AC
  Filled 2021-06-10: qty 5

## 2021-06-10 MED ORDER — PHENYLEPHRINE HCL-NACL 20-0.9 MG/250ML-% IV SOLN
INTRAVENOUS | Status: DC | PRN
  Administered 2021-06-10: 100 ug/min via INTRAVENOUS

## 2021-06-10 MED ORDER — MENTHOL 3 MG MT LOZG
1.0000 | LOZENGE | OROMUCOSAL | Status: DC | PRN
  Filled 2021-06-10: qty 9

## 2021-06-10 MED ORDER — SODIUM CHLORIDE 0.9% FLUSH: 3.0000 mL | INTRAVENOUS | Status: DC | PRN

## 2021-06-10 MED ORDER — SODIUM CHLORIDE 0.9% FLUSH: 3.0000 mL | Freq: Two times a day (BID) | INTRAVENOUS | Status: DC

## 2021-06-10 MED ORDER — ONDANSETRON HCL 4 MG/2ML IJ SOLN: 4.0000 mg | Freq: Four times a day (QID) | INTRAMUSCULAR | Status: DC | PRN

## 2021-06-10 MED ORDER — LACTATED RINGERS IV SOLN: INTRAVENOUS | Status: DC

## 2021-06-10 MED ORDER — ROCURONIUM BROMIDE 10 MG/ML (PF) SYRINGE
PREFILLED_SYRINGE | INTRAVENOUS | Status: AC
  Filled 2021-06-10: qty 20

## 2021-06-10 MED ORDER — PHENYLEPHRINE 40 MCG/ML (10ML) SYRINGE FOR IV PUSH (FOR BLOOD PRESSURE SUPPORT)
PREFILLED_SYRINGE | INTRAVENOUS | Status: DC | PRN
  Administered 2021-06-10 (×2): 40 ug via INTRAVENOUS
  Administered 2021-06-10: 120 ug via INTRAVENOUS

## 2021-06-10 MED ORDER — SUGAMMADEX SODIUM 200 MG/2ML IV SOLN
INTRAVENOUS | Status: DC | PRN
  Administered 2021-06-10: 400 mg via INTRAVENOUS

## 2021-06-10 MED ORDER — ALBUTEROL SULFATE HFA 108 (90 BASE) MCG/ACT IN AERS: 2.0000 | INHALATION_SPRAY | RESPIRATORY_TRACT | Status: DC | PRN

## 2021-06-10 MED ORDER — METHOCARBAMOL 500 MG PO TABS
500.0000 mg | ORAL_TABLET | Freq: Four times a day (QID) | ORAL | Status: DC | PRN
  Administered 2021-06-10 – 2021-06-11 (×2): 500 mg via ORAL
  Filled 2021-06-10 (×2): qty 1

## 2021-06-10 MED ORDER — BUPIVACAINE-EPINEPHRINE 0.25% -1:200000 IJ SOLN
INTRAMUSCULAR | Status: DC | PRN
  Administered 2021-06-10: 20 mL

## 2021-06-10 MED ORDER — HYDROMORPHONE HCL 1 MG/ML IJ SOLN
0.2500 mg | INTRAMUSCULAR | Status: DC | PRN
  Administered 2021-06-10 (×4): 0.5 mg via INTRAVENOUS

## 2021-06-10 MED ORDER — ACETAMINOPHEN 500 MG PO TABS
1000.0000 mg | ORAL_TABLET | Freq: Once | ORAL | Status: AC
  Administered 2021-06-10: 1000 mg via ORAL
  Filled 2021-06-10: qty 2

## 2021-06-10 MED ORDER — CEFAZOLIN SODIUM-DEXTROSE 2-4 GM/100ML-% IV SOLN
2.0000 g | INTRAVENOUS | Status: AC
  Administered 2021-06-10 (×2): 2 g via INTRAVENOUS
  Filled 2021-06-10: qty 100

## 2021-06-10 MED ORDER — FENTANYL CITRATE (PF) 250 MCG/5ML IJ SOLN
INTRAMUSCULAR | Status: DC | PRN
  Administered 2021-06-10 (×2): 100 ug via INTRAVENOUS
  Administered 2021-06-10: 50 ug via INTRAVENOUS

## 2021-06-10 MED ORDER — METHOCARBAMOL 1000 MG/10ML IJ SOLN
500.0000 mg | Freq: Four times a day (QID) | INTRAVENOUS | Status: DC | PRN
  Filled 2021-06-10: qty 5

## 2021-06-10 MED ORDER — CEFAZOLIN SODIUM-DEXTROSE 2-4 GM/100ML-% IV SOLN
2.0000 g | Freq: Three times a day (TID) | INTRAVENOUS | Status: AC
  Administered 2021-06-11 (×2): 2 g via INTRAVENOUS
  Filled 2021-06-10 (×2): qty 100

## 2021-06-10 MED ORDER — POVIDONE-IODINE 7.5 % EX SOLN
Freq: Once | CUTANEOUS | Status: DC
  Filled 2021-06-10: qty 118

## 2021-06-10 MED ORDER — ALUM & MAG HYDROXIDE-SIMETH 200-200-20 MG/5ML PO SUSP: 30.0000 mL | Freq: Four times a day (QID) | ORAL | Status: DC | PRN

## 2021-06-10 MED ORDER — MUPIROCIN 2 % EX OINT
1.0000 "application " | TOPICAL_OINTMENT | Freq: Two times a day (BID) | CUTANEOUS | Status: DC
  Administered 2021-06-11 (×2): 1 via NASAL
  Filled 2021-06-10: qty 22

## 2021-06-10 MED ORDER — FENTANYL CITRATE (PF) 250 MCG/5ML IJ SOLN
INTRAMUSCULAR | Status: AC
  Filled 2021-06-10: qty 5

## 2021-06-10 MED ORDER — ACETAMINOPHEN 325 MG PO TABS: 650.0000 mg | ORAL_TABLET | ORAL | Status: DC | PRN

## 2021-06-10 MED ORDER — DOCUSATE SODIUM 100 MG PO CAPS
100.0000 mg | ORAL_CAPSULE | Freq: Two times a day (BID) | ORAL | Status: DC
  Administered 2021-06-10 – 2021-06-11 (×2): 100 mg via ORAL
  Filled 2021-06-10 (×2): qty 1

## 2021-06-10 MED ORDER — BUPIVACAINE LIPOSOME 1.3 % IJ SUSP
INTRAMUSCULAR | Status: DC | PRN
Start: 2021-06-10 — End: 2021-06-10
  Administered 2021-06-10: 20 mL

## 2021-06-10 MED ORDER — BACITRACIN ZINC 500 UNIT/GM EX OINT
TOPICAL_OINTMENT | CUTANEOUS | Status: DC | PRN
  Administered 2021-06-10: 1 via TOPICAL

## 2021-06-10 MED ORDER — MIDAZOLAM HCL 2 MG/2ML IJ SOLN
INTRAMUSCULAR | Status: AC
  Filled 2021-06-10: qty 2

## 2021-06-10 MED ORDER — KETAMINE HCL 50 MG/5ML IJ SOSY
PREFILLED_SYRINGE | INTRAMUSCULAR | Status: AC
  Filled 2021-06-10: qty 5

## 2021-06-10 MED ORDER — ACETAMINOPHEN 650 MG RE SUPP: 650.0000 mg | RECTAL | Status: DC | PRN

## 2021-06-10 MED ORDER — LIDOCAINE 2% (20 MG/ML) 5 ML SYRINGE
INTRAMUSCULAR | Status: DC | PRN
  Administered 2021-06-10: 60 mg via INTRAVENOUS

## 2021-06-10 MED ORDER — PROPOFOL 10 MG/ML IV BOLUS
INTRAVENOUS | Status: DC | PRN
Start: 2021-06-10 — End: 2021-06-10
  Administered 2021-06-10: 175 mg via INTRAVENOUS
  Administered 2021-06-10: 25 mg via INTRAVENOUS

## 2021-06-10 MED ORDER — BACITRACIN ZINC 500 UNIT/GM EX OINT
TOPICAL_OINTMENT | CUTANEOUS | Status: AC
  Filled 2021-06-10: qty 28.35

## 2021-06-10 MED ORDER — HYDROMORPHONE HCL 1 MG/ML IJ SOLN
INTRAMUSCULAR | Status: AC
  Filled 2021-06-10: qty 0.5

## 2021-06-10 MED ORDER — ORAL CARE MOUTH RINSE: 15.0000 mL | Freq: Once | OROMUCOSAL | Status: AC

## 2021-06-10 MED ORDER — PROMETHAZINE HCL 25 MG/ML IJ SOLN
INTRAMUSCULAR | Status: AC
  Administered 2021-06-10: 6.25 mg via INTRAVENOUS
  Filled 2021-06-10: qty 1

## 2021-06-10 MED ORDER — CEFAZOLIN SODIUM 1 G IJ SOLR
INTRAMUSCULAR | Status: AC
  Filled 2021-06-10: qty 20

## 2021-06-10 MED ORDER — DEXAMETHASONE SODIUM PHOSPHATE 10 MG/ML IJ SOLN
INTRAMUSCULAR | Status: AC
  Filled 2021-06-10: qty 1

## 2021-06-10 MED ORDER — SENNOSIDES-DOCUSATE SODIUM 8.6-50 MG PO TABS
1.0000 | ORAL_TABLET | Freq: Every evening | ORAL | Status: DC | PRN
  Administered 2021-06-10: 1 via ORAL
  Filled 2021-06-10: qty 1

## 2021-06-10 MED ORDER — ZOLPIDEM TARTRATE 5 MG PO TABS: 5.0000 mg | ORAL_TABLET | Freq: Every evening | ORAL | Status: DC | PRN

## 2021-06-10 MED ORDER — BISACODYL 5 MG PO TBEC: 5.0000 mg | DELAYED_RELEASE_TABLET | Freq: Every day | ORAL | Status: DC | PRN

## 2021-06-10 MED ORDER — PROMETHAZINE HCL 25 MG/ML IJ SOLN: 6.2500 mg | INTRAMUSCULAR | Status: DC | PRN

## 2021-06-10 MED ORDER — PANTOPRAZOLE SODIUM 40 MG PO TBEC
40.0000 mg | DELAYED_RELEASE_TABLET | Freq: Every day | ORAL | Status: DC
  Administered 2021-06-10: 40 mg via ORAL

## 2021-06-10 MED ORDER — THROMBIN 20000 UNITS EX SOLR
CUTANEOUS | Status: DC | PRN
  Administered 2021-06-10: 20000 [IU] via TOPICAL

## 2021-06-10 MED ORDER — PHENOL 1.4 % MT LIQD: 1.0000 | OROMUCOSAL | Status: DC | PRN

## 2021-06-10 MED ORDER — PROPOFOL 10 MG/ML IV BOLUS
INTRAVENOUS | Status: AC
  Filled 2021-06-10: qty 20

## 2021-06-10 MED ORDER — ONDANSETRON HCL 4 MG/2ML IJ SOLN
INTRAMUSCULAR | Status: DC | PRN
  Administered 2021-06-10: 4 mg via INTRAVENOUS

## 2021-06-10 MED ORDER — HYDROCODONE-ACETAMINOPHEN 5-325 MG PO TABS: 1.0000 | ORAL_TABLET | ORAL | Status: DC | PRN

## 2021-06-10 MED ORDER — PANTOPRAZOLE SODIUM 40 MG IV SOLR: 40.0000 mg | Freq: Every day | INTRAVENOUS | Status: DC

## 2021-06-10 MED ORDER — BUPIVACAINE LIPOSOME 1.3 % IJ SUSP
INTRAMUSCULAR | Status: AC
  Filled 2021-06-10: qty 20

## 2021-06-10 MED ORDER — EPINEPHRINE 0.3 MG/0.3ML IJ SOAJ: 0.3000 mg | INTRAMUSCULAR | Status: DC | PRN

## 2021-06-10 MED ORDER — OXYCODONE-ACETAMINOPHEN 5-325 MG PO TABS
1.0000 | ORAL_TABLET | ORAL | Status: DC | PRN
  Administered 2021-06-10 – 2021-06-11 (×4): 2 via ORAL
  Filled 2021-06-10 (×4): qty 2

## 2021-06-10 MED ORDER — ONDANSETRON HCL 4 MG PO TABS: 4.0000 mg | ORAL_TABLET | Freq: Four times a day (QID) | ORAL | Status: DC | PRN

## 2021-06-10 SURGICAL SUPPLY — 83 items
BAG COUNTER SPONGE SURGICOUNT (BAG) ×2 IMPLANT
BENZOIN TINCTURE PRP APPL 2/3 (GAUZE/BANDAGES/DRESSINGS) ×3 IMPLANT
BIT DRILL 2.2 F/3.0 SCRW (BIT) ×1 IMPLANT
BIT DRILL 2.4 F/3.5 SCRW (BIT) ×1 IMPLANT
BLADE CLIPPER SURG NEURO (BLADE) IMPLANT
BONE VIVIGEN FORMABLE 1.3CC (Bone Implant) ×2 IMPLANT
BONE VIVIGEN FORMABLE 5.4CC (Bone Implant) ×2 IMPLANT
BUR MATCHSTICK NEURO 3.0 LAGG (BURR) ×2 IMPLANT
BUR PRESCISION 1.7 ELITE (BURR) ×2 IMPLANT
CLSR STERI-STRIP ANTIMIC 1/2X4 (GAUZE/BANDAGES/DRESSINGS) ×1 IMPLANT
CNTNR URN SCR LID CUP LEK RST (MISCELLANEOUS) ×1 IMPLANT
CONT SPEC 4OZ STRL OR WHT (MISCELLANEOUS) ×1
CORD BIPOLAR FORCEPS 12FT (ELECTRODE) ×2 IMPLANT
COVER BACK TABLE 80X110 HD (DRAPES) ×2 IMPLANT
COVER SURGICAL LIGHT HANDLE (MISCELLANEOUS) ×2 IMPLANT
DRAIN CHANNEL 10F 3/8 F FF (DRAIN) IMPLANT
DRAIN CHANNEL 15F RND FF W/TCR (WOUND CARE) IMPLANT
DRAIN HEMOVAC 1/8 X 5 (WOUND CARE) IMPLANT
DRAPE C-ARM 42X72 X-RAY (DRAPES) IMPLANT
DRAPE HALF SHEET 40X57 (DRAPES) ×10 IMPLANT
DRAPE INCISE IOBAN 66X45 STRL (DRAPES) ×2 IMPLANT
DRAPE LAPAROTOMY 100X72 PEDS (DRAPES) ×2 IMPLANT
DRAPE POUCH INSTRU U-SHP 10X18 (DRAPES) ×2 IMPLANT
DRAPE SURG 17X23 STRL (DRAPES) ×16 IMPLANT
DRSG MEPILEX BORDER 4X8 (GAUZE/BANDAGES/DRESSINGS) ×2 IMPLANT
DURAPREP 26ML APPLICATOR (WOUND CARE) ×2 IMPLANT
ELECT BLADE 4.0 EZ CLEAN MEGAD (MISCELLANEOUS) ×2
ELECT CAUTERY BLADE 6.4 (BLADE) ×2 IMPLANT
ELECT REM PT RETURN 9FT ADLT (ELECTROSURGICAL) ×2
ELECTRODE BLDE 4.0 EZ CLN MEGD (MISCELLANEOUS) ×1 IMPLANT
ELECTRODE REM PT RTRN 9FT ADLT (ELECTROSURGICAL) ×1 IMPLANT
EVACUATOR SILICONE 100CC (DRAIN) IMPLANT
GAUZE 4X4 16PLY ~~LOC~~+RFID DBL (SPONGE) ×8 IMPLANT
GAUZE SPONGE 4X4 12PLY STRL (GAUZE/BANDAGES/DRESSINGS) ×2 IMPLANT
GLOVE SRG 8 PF TXTR STRL LF DI (GLOVE) ×1 IMPLANT
GLOVE SURG ENC MOIS LTX SZ6.5 (GLOVE) ×2 IMPLANT
GLOVE SURG ENC MOIS LTX SZ8 (GLOVE) ×2 IMPLANT
GLOVE SURG UNDER POLY LF SZ7.5 (GLOVE) ×2 IMPLANT
GLOVE SURG UNDER POLY LF SZ8 (GLOVE) ×1
GOWN STRL REUS W/ TWL LRG LVL3 (GOWN DISPOSABLE) ×4 IMPLANT
GOWN STRL REUS W/ TWL XL LVL3 (GOWN DISPOSABLE) ×1 IMPLANT
GOWN STRL REUS W/TWL LRG LVL3 (GOWN DISPOSABLE) ×4
GOWN STRL REUS W/TWL XL LVL3 (GOWN DISPOSABLE) ×1
GRAFT BNE MATRIX VG FRMBL MD 5 (Bone Implant) IMPLANT
GRAFT BNE MATRIX VG FRMBL SM 1 (Bone Implant) IMPLANT
IV CATH 14GX2 1/4 (CATHETERS) ×2 IMPLANT
KIT BASIN OR (CUSTOM PROCEDURE TRAY) ×2 IMPLANT
KIT TURNOVER KIT B (KITS) ×2 IMPLANT
NDL HYPO 25GX1X1/2 BEV (NEEDLE) ×1 IMPLANT
NDL PRECISIONGLIDE 27X1.5 (NEEDLE) ×1 IMPLANT
NEEDLE HYPO 25GX1X1/2 BEV (NEEDLE) ×2 IMPLANT
NEEDLE PRECISIONGLIDE 27X1.5 (NEEDLE) ×2 IMPLANT
NS IRRIG 1000ML POUR BTL (IV SOLUTION) ×2 IMPLANT
PACK LAMINECTOMY ORTHO (CUSTOM PROCEDURE TRAY) ×2 IMPLANT
PACK UNIVERSAL I (CUSTOM PROCEDURE TRAY) ×2 IMPLANT
PAD ARMBOARD 7.5X6 YLW CONV (MISCELLANEOUS) ×4 IMPLANT
PATTIES SURGICAL .5 X.5 (GAUZE/BANDAGES/DRESSINGS) ×2 IMPLANT
PIN MAYFIELD SKULL DISP (PIN) ×2 IMPLANT
PUTTY DBX 1CC (Putty) ×2 IMPLANT
PUTTY DBX 1CC DEPUY (Putty) IMPLANT
ROD LOR TI 4.0X050 SYMPH (Rod) ×2 IMPLANT
SCREW 4.0 PLY 3.5X14 (Screw) ×4 IMPLANT
SCREW POLY 4X22 (Screw) ×2 IMPLANT
SCREW SET SPINAL (Screw) ×6 IMPLANT
SPONGE INTESTINAL PEANUT (DISPOSABLE) IMPLANT
SPONGE SURGIFOAM ABS GEL 100 (HEMOSTASIS) ×2 IMPLANT
STRIP CLOSURE SKIN 1/2X4 (GAUZE/BANDAGES/DRESSINGS) IMPLANT
SURGIFLO W/THROMBIN 8M KIT (HEMOSTASIS) IMPLANT
SUT MNCRL AB 4-0 PS2 18 (SUTURE) ×2 IMPLANT
SUT VIC AB 0 CT1 18XCR BRD 8 (SUTURE) ×1 IMPLANT
SUT VIC AB 0 CT1 8-18 (SUTURE) ×1
SUT VIC AB 1 CT1 18XCR BRD 8 (SUTURE) ×2 IMPLANT
SUT VIC AB 1 CT1 8-18 (SUTURE) ×3
SUT VIC AB 2-0 CT2 18 VCP726D (SUTURE) ×3 IMPLANT
SYR BULB IRRIG 60ML STRL (SYRINGE) ×2 IMPLANT
SYR CONTROL 10ML LL (SYRINGE) ×2 IMPLANT
TAP THREADED CFX 4.0 (ORTHOPEDIC DISPOSABLE SUPPLIES) ×1 IMPLANT
TAPE CLOTH 4X10 WHT NS (GAUZE/BANDAGES/DRESSINGS) ×2 IMPLANT
TOWEL GREEN STERILE (TOWEL DISPOSABLE) ×2 IMPLANT
TOWEL GREEN STERILE FF (TOWEL DISPOSABLE) ×2 IMPLANT
TRAY FOLEY MTR SLVR 16FR STAT (SET/KITS/TRAYS/PACK) ×2 IMPLANT
WATER STERILE IRR 1000ML POUR (IV SOLUTION) ×2 IMPLANT
YANKAUER SUCT BULB TIP NO VENT (SUCTIONS) ×2 IMPLANT

## 2021-06-10 NOTE — Anesthesia Procedure Notes (Signed)
Procedure Name: Intubation Date/Time: 06/10/2021 12:52 PM Performed by: Michele Rockers, CRNA Pre-anesthesia Checklist: Patient identified, Patient being monitored, Timeout performed, Emergency Drugs available and Suction available Patient Re-evaluated:Patient Re-evaluated prior to induction Oxygen Delivery Method: Circle system utilized Preoxygenation: Pre-oxygenation with 100% oxygen Induction Type: IV induction Ventilation: Mask ventilation without difficulty Laryngoscope Size: Mac, 4 and Glidescope Grade View: Grade I Tube type: Oral Tube size: 8.0 mm Number of attempts: 1 Airway Equipment and Method: Stylet and Video-laryngoscopy Placement Confirmation: ETT inserted through vocal cords under direct vision, positive ETCO2 and breath sounds checked- equal and bilateral Secured at: 23 cm Tube secured with: Tape Dental Injury: Teeth and Oropharynx as per pre-operative assessment  Difficulty Due To: Difficult Airway-  due to neck instability

## 2021-06-10 NOTE — Progress Notes (Signed)
Per Dr. Sampson Goon,   Do I-stat on DOS, due to abnormal labs within 30 days.

## 2021-06-10 NOTE — Op Note (Signed)
PATIENT NAME: Jeffrey Nash   MEDICAL RECORD NO.:   [id number]    DATE OF BIRTH: April 21, 1966    DATE OF PROCEDURE: 06/10/2021                                      OPERATIVE REPORT   PREOPERATIVE DIAGNOSES: 1.  Status post anterior fusions involving C4-5, C5-6, and C6-7 2.  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  Status post anterior fusions involving C4-5, C5-6, and C6-7 2.  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  Posterior spinal fusion C4-C5, C5-C6, C6-7 2.  Placement of posterior segmental instrumentation C4-C7. 3.  Bilateral foraminotomies at C5-6, and C6-7 4   Use of local autograft. 5.  Use of morselized allograft - Vivigen 6.  Cranial tong application and removal. 7.  Intraoperative use of fluoroscopy.  SURGEON:  Phylliss Bob, MD  ASSISTANT:  Pricilla Holm, PA-C   ANESTHESIA:  General endotracheal anesthesia.  COMPLICATIONS:  None.  DISPOSITION:  Stable.  ESTIMATED BLOOD LOSS:  Minimal.  INDICATIONS FOR SURGERY:  Briefly, the patient is a very pleasant 56 year old male who is status post the surgery as noted above.  The patient did subsequently have ongoing pain and neurologic symptoms into the neck and bilateral upper extremities.  A CAT scan of her cervical spine did reveal  a probable nonunions spanning C4-C7.  Given her ongoing pain and nonunion, we did discuss proceeding with the procedure reflected above.  The patient was fully aware of the risks and limitations of surgery and did wish to proceed.  OPERATIVE DETAILS:  On 06/10/2021, the patient was brought to surgery and general endotracheal anesthesia was administered.  A Mayfield head holder was applied by me.  The patient was then rolled prone,  and the Mayfield headholder was secured to the bed  with the head positioned into the appropriate degree of lordosis.  The patient's arms were secured to his sides, and all bony prominences were padded.  The neck was then prepped and draped posteriorly in the usual fashion.  A timeout procedure was  performed.  At this point, I did make a midline incision.  The fascia was incised at the midline.  The paraspinal musculature was bluntly swept laterally, and the posterior elements of C4, C5, C6 and C7 were identified and subperiosteally exposed.  A lateral x-ray to confirm the appropriate operative level.  The  facet joints to be fused were subperiosteally exposed as well.  I did use a 1.7 mm high-speed burr to decorticate the facet joints at C4-C5, C5-C6, and C6-7 bilaterally.  A 3 mm bur was used to decorticate the posterior elements across these levels as  well.  Using anatomic landmarks, I did use a 1.7 mm bur to prepare the entry point of the lateral mass screws at C4, C5, and C6 bilaterally.  At C4 and C5, I did tap up to a 3 mm tap, using a superior and lateral trajectory technique.  At C7 bilaterally, I did use a gearshift probe and a 4 mm tap.  A ball-tipped probe was used to confirm that there was no cortical violation.  Bone wax was placed in the cannulated pedicle holes.  At this point, I proceeded with the decompressive aspect of the procedure.  Bilateral foraminotomies were performed at C5-6 and C6-7.  The MRI did reveal neuroforaminal stenosis in these regions, which I did feel did correlate to the patient's numbness and pain.  A thorough neuroforaminal decompression was performed on the right and left sides at C5-6 and C6-7.  At the termination of the decompression, I was easily able to pass a nerve hook out the foramen, lateral to the pedicles below.  At this point, a 3 mm bur was used to decorticate the posterior elements at C4, C5, C6, and C7.  I then placed DBX putty into the facet joints at  C4-5, C5-6, and C6-7.  At this point, 3.5 x 14 mm screws were secured into the lateral masses of C3 and C4 bilaterally.  A 4 x 22 millimeter screw was placed on the right and left sides at the C7 pedicles.  Excellent purchase was noted while placing the lateral mass and pedicle screws.  I then placed abundant vision along the posterior elements from C4-C7.  Autograft taken from the spinous processes and from the decompression was also packed posteriorly.  A rod was then contoured into the appropriate degree of lordosis and secured into the tulip heads of the screws bilaterally.  Caps were then placed, and a final locking procedure was performed.  Prior to placing the bone graft, I did liberally irrigate the wound with a total of approximately 2 L of normal saline.  I  did use AP and lateral fluoroscopy to ensure appropriate positioning of the screws.  At this point, the wound was closed in layers using #1 Vicryl, followed by 2-0 Vicryl, followed by 4-0 Monocryl.  Benzoin and Steri-Strips were applied, followed by sterile dressing.  All instrument counts were correct at the termination of the procedure.  Of note, Pricilla Holm was my assistant throughout surgery and did aid in retraction, suctioning, the decompression, placement of the hardware, and closure from start to finish.  Phylliss Bob, MD

## 2021-06-10 NOTE — H&P (Signed)
PREOPERATIVE H&P  Chief Complaint: Neck pain, bilateral arm pain and numbness  HPI: Jeffrey Nash is a 56 y.o. male who presents with ongoing pain and numbness in the bilateral arms.  A recent EMG/NCS did reveal bilateral cervical radiculopathy.  MRI reveals neuroforaminal narrowing spanning C4-C7, and a CAT scan does reveal probable nonunions at C4-5, C5-6, and C6-7.  Patient has failed multiple forms of conservative care and continues to have pain (see office notes for additional details regarding the patient's full course of treatment)  Past Medical History:  Diagnosis Date   Arthritis    Cervical radiculopathy    Headache    migraines   Hernia of abdominal wall    Hypertension    Nerve pain    Pneumonia 03/2021   Sleep apnea    Past Surgical History:  Procedure Laterality Date   ANTERIOR CERVICAL DECOMP/DISCECTOMY FUSION N/A 01/18/2019   Procedure: ANTERIOR CERVICAL DECOMPRESSION FUSION CERVICAL SIX-SEVEN WITH INSTRUMENTATION AND ALLOGRAFT;  Surgeon: Estill Bamberg, MD;  Location: [facility] OR;  Service: Orthopedics;  Laterality: N/A;   ANTERIOR CERVICAL DECOMP/DISCECTOMY FUSION N/A 06/11/2020   Procedure: ANTERIOR CERVICAL DECOMPRESSION FUSION CERVICAL 5- CERVICAL 6 WITH INSTRUMENTATION AND ALLOGRAFT;  Surgeon: Estill Bamberg, MD;  Location: [facility] OR;  Service: Orthopedics;  Laterality: N/A;   APPENDECTOMY     BACK SURGERY     CERVICAL SPINE SURGERY     COLONOSCOPY     LUMBAR DISC SURGERY     NASAL ENDOSCOPY     POSTERIOR FUSION LUMBAR SPINE     SEPTOPLASTY WITH TURBINATE REDUCTION RHINOPLASTY      SHOULDER ARTHROSCOPY     TONSILLECTOMY     Social History   Socioeconomic History   Marital status: Single    Spouse name: Not on file   Number of children: Not on file   Years of education: Not on file   Highest education level: Not on file  Occupational History   Not on file  Tobacco Use   Smoking status: Every Day    Packs/day: 0.25    Types: Pipe, Cigarettes    Last  attempt to quit: 06/09/2020    Years since quitting: 1.0   Smokeless tobacco: Former    Types: Snuff, Chew  Vaping Use   Vaping Use: Never used  Substance and Sexual Activity   Alcohol use: Not Currently   Drug use: Not Currently    Types: Marijuana    Comment: gummie   Sexual activity: Not on file  Other Topics Concern   Not on file  Social History Narrative   Not on file   Social Determinants of Health   Financial Resource Strain: Not on file  Food Insecurity: Not on file  Transportation Needs: Not on file  Physical Activity: Not on file  Stress: Not on file  Social Connections: Not on file   History reviewed. No pertinent family history. Allergies  Allergen Reactions   Bee Venom Anaphylaxis and Swelling   Prior to Admission medications   Medication Sig Start Date End Date Taking? Authorizing Provider  acetaminophen (TYLENOL) 650 MG CR tablet Take 1,300 mg by mouth every 8 (eight) hours as needed for pain.   Yes [provider]  albuterol (VENTOLIN HFA) 108 (90 Base) MCG/ACT inhaler Inhale 2 puffs into the lungs every 4 (four) hours as needed for shortness of breath. 05/27/21  Yes [provider]  EPINEPHrine 0.3 mg/0.3 mL IJ SOAJ injection Inject 0.3 mg into the muscle as needed  for anaphylaxis.   Yes [provider]  HYDROcodone-acetaminophen (NORCO) 10-325 MG tablet Take 1 tablet by mouth every 6 (six) hours as needed for pain. 06/01/21  Yes [provider]  lisinopril (ZESTRIL) 20 MG tablet Take 20 mg by mouth daily.   Yes [provider]  OVER THE COUNTER MEDICATION Place 1 drop into both eyes daily as needed (redness). Rohto Max Strength   Yes [provider]     All other systems have been reviewed and were otherwise negative with the exception of those mentioned in the HPI and as above.  Physical Exam: Vitals:   06/10/21 1034  BP: (!) 159/97  Pulse: 94  Resp: 18  Temp: 98 F (36.7 C)  SpO2: 99%    Body  mass index is 33.09 kg/m.  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  VARYING DEGREES OF NEUROFORAMINAL STENOSIS, SPANNING C4-C7 Plan for Procedure(s): POSTERIOR CERVICAL DECOMPRESSION AND FUSION CERVICAL 4- CERVICAL 5, CERVICAL 5- CERVICAL 6, CERVICAL 6- CERVICAL 7 WITH INSTRUMENTATION AND ALLOGRAFT   Jackelyn Hoehn, MD 06/10/2021 11:48 AM

## 2021-06-10 NOTE — Transfer of Care (Signed)
Immediate Anesthesia Transfer of Care Note  Patient: Jeffrey Nash  Procedure(s) Performed: POSTERIOR CERVICAL DECOMPRESSION FUSION CERVICAL 4- CERVICAL 5, CERVICAL 5- CERVICAL 6, CERVICAL 6- CERVICAL 7 WITH INSTRUMENTATION AND ALLOGRAFT  Patient Location: PACU  Anesthesia Type:General  Level of Consciousness: awake, alert  and oriented  Airway & Oxygen Therapy: Patient Spontanous Breathing and Patient connected to nasal cannula oxygen  Post-op Assessment: Report given to RN and Post -op Vital signs reviewed and stable  Post vital signs: Reviewed and stable  Last Vitals:  Vitals Value Taken Time  BP 145/81 06/10/21 1744  Temp    Pulse 91 06/10/21 1745  Resp 19 06/10/21 1745  SpO2 91 % 06/10/21 1745  Vitals shown include unvalidated device data.  Last Pain:  Vitals:   06/10/21 1050  TempSrc:   PainSc: 7          Complications: No notable events documented.

## 2021-06-10 NOTE — Anesthesia Preprocedure Evaluation (Addendum)
Anesthesia Evaluation  Patient identified by MRN, date of birth, ID band Patient awake    Reviewed: Allergy & Precautions, H&P , NPO status , Patient's Chart, lab work & pertinent test results  Airway Mallampati: II  TM Distance: >3 FB Neck ROM: Full    Dental no notable dental hx. (+) Teeth Intact, Dental Advisory Given   Pulmonary sleep apnea , Current SmokerPatient did not abstain from smoking.,    Pulmonary exam normal breath sounds clear to auscultation       Cardiovascular hypertension, Pt. on medications  Rhythm:Regular Rate:Normal     Neuro/Psych  Headaches, negative psych ROS   GI/Hepatic negative GI ROS, Neg liver ROS,   Endo/Other  negative endocrine ROS  Renal/GU negative Renal ROS  negative genitourinary   Musculoskeletal  (+) Arthritis , Osteoarthritis,    Abdominal   Peds  Hematology negative hematology ROS (+)   Anesthesia Other Findings   Reproductive/Obstetrics negative OB ROS                            Anesthesia Physical Anesthesia Plan  ASA: 3  Anesthesia Plan: General   Post-op Pain Management: Tylenol PO (pre-op)   Induction: Intravenous  PONV Risk Score and Plan: 2 and Ondansetron, Dexamethasone and Midazolam  Airway Management Planned: Oral ETT and Video Laryngoscope Planned  Additional Equipment:   Intra-op Plan:   Post-operative Plan: Extubation in OR  Informed Consent: I have reviewed the patients History and Physical, chart, labs and discussed the procedure including the risks, benefits and alternatives for the proposed anesthesia with the patient or authorized representative who has indicated his/her understanding and acceptance.     Dental advisory given  Plan Discussed with: CRNA  Anesthesia Plan Comments:         Anesthesia Quick Evaluation

## 2021-06-11 DIAGNOSIS — I10 Essential (primary) hypertension: Secondary | ICD-10-CM | POA: Diagnosis present

## 2021-06-11 DIAGNOSIS — M5412 Radiculopathy, cervical region: Secondary | ICD-10-CM | POA: Diagnosis present

## 2021-06-11 DIAGNOSIS — Z79899 Other long term (current) drug therapy: Secondary | ICD-10-CM | POA: Diagnosis not present

## 2021-06-11 DIAGNOSIS — Z91030 Bee allergy status: Secondary | ICD-10-CM | POA: Diagnosis not present

## 2021-06-11 DIAGNOSIS — F17210 Nicotine dependence, cigarettes, uncomplicated: Secondary | ICD-10-CM | POA: Diagnosis present

## 2021-06-11 DIAGNOSIS — M542 Cervicalgia: Secondary | ICD-10-CM | POA: Diagnosis present

## 2021-06-11 DIAGNOSIS — Z20822 Contact with and (suspected) exposure to covid-19: Secondary | ICD-10-CM | POA: Diagnosis present

## 2021-06-11 MED ORDER — OXYCODONE-ACETAMINOPHEN 5-325 MG PO TABS: 1.0000 | ORAL_TABLET | ORAL | 0 refills | Status: AC | PRN

## 2021-06-11 MED ORDER — METHOCARBAMOL 500 MG PO TABS
500.0000 mg | ORAL_TABLET | Freq: Four times a day (QID) | ORAL | 2 refills | Status: AC | PRN
Start: 2021-06-11 — End: ?

## 2021-06-11 NOTE — Anesthesia Postprocedure Evaluation (Signed)
Anesthesia Post Note  Patient: Jeffrey Nash  Procedure(s) Performed: POSTERIOR CERVICAL DECOMPRESSION FUSION CERVICAL 4- CERVICAL 5, CERVICAL 5- CERVICAL 6, CERVICAL 6- CERVICAL 7 WITH INSTRUMENTATION AND ALLOGRAFT     Patient location during evaluation: PACU Anesthesia Type: General Level of consciousness: awake and alert Pain management: pain level controlled Vital Signs Assessment: post-procedure vital signs reviewed and stable Respiratory status: spontaneous breathing, nonlabored ventilation, respiratory function stable and patient connected to nasal cannula oxygen Cardiovascular status: blood pressure returned to baseline and stable Postop Assessment: no apparent nausea or vomiting Anesthetic complications: no   No notable events documented.  Last Vitals:  Vitals:   06/11/21 0256 06/11/21 0315  BP: (!) 149/80   Pulse: 66 82  Resp: 20 19  Temp: (!) 36.4 C   SpO2: 97% 96%    Last Pain:  Vitals:   06/11/21 0615  TempSrc:   PainSc: 5                  Tahja Liao P Trestin Vences

## 2021-06-11 NOTE — Progress Notes (Signed)
° ° °  Patient doing well  Denies arm pain Tolerating PO well Reports improved feeling in left hand Reports pain with ROM of right fingers and swelling   Physical Exam: Vitals:   06/11/21 0256 06/11/21 0315  BP: (!) 149/80   Pulse: 66 82  Resp: 20 19  Temp: (!) 97.5 F (36.4 C)   SpO2: 97% 96%    Neck soft/supple Dressing and collar in place NVI Pain in right hand diffusely to palpation No focal strength deficits noted, although pain in hand noted with active and passive hand ROM  POD #1 s/p C4-C7 decompression and fusion, doing well with improved left arm symptoms and curious right finger swelling and pain with ROM  - encourage ambulation - Percocet for pain, Robaxin for muscle spasms - d/c home today with f/u in 2 weeks  - will keep an eye on right finger symptoms and swelling, as this is possibly due to positioning.

## 2021-06-11 NOTE — Evaluation (Signed)
Physical Therapy Evaluation Patient Details Name: Jeffrey Nash MRN: [id number] DOB: 17-Mar-1966 Today's Date: 06/11/2021  History of Present Illness  Pt adm 1/18 for ACDF C4-C7. PMH - multiple cervical fusions, multiple back surgeries, shoulder surgery, arthritis, migraines, HTN.  Clinical Impression  Pt doing well with mobility and no further PT needed.  Ready for dc from PT standpoint.        Recommendations for follow up therapy are one component of a multi-disciplinary discharge planning process, led by the attending physician.  Recommendations may be updated based on patient status, additional functional criteria and insurance authorization.  Follow Up Recommendations No PT follow up    Assistance Recommended at Discharge None  Patient can return home with the following       Equipment Recommendations None recommended by PT  Recommendations for Other Services       Functional Status Assessment Patient has not had a recent decline in their functional status     Precautions / Restrictions Precautions Precautions: Cervical Precaution Booklet Issued: Yes (comment) Precaution Comments: Reviewed precautions and compensatory strategies Required Braces or Orthoses: Cervical Brace Cervical Brace: Hard collar;At all times Restrictions Weight Bearing Restrictions: No      Mobility  Bed Mobility Overal bed mobility: Modified Independent             General bed mobility comments: Reviewed log roll technique, requires increased time    Transfers Overall transfer level: Modified independent Equipment used: None               General transfer comment: No assist need, balance appears [facility]    Ambulation/Gait Ambulation/Gait assistance: Modified independent (Device/Increase time) Gait Distance (Feet): 500 Feet Assistive device: None Gait Pattern/deviations: Step-through pattern, Wide base of support Gait velocity: adequate Gait velocity interpretation: >2.62  ft/sec, indicative of community ambulatory   General Gait Details: Steady gait  Stairs Stairs: Yes Stairs assistance: Modified independent (Device/Increase time) Stair Management: One rail Left, Alternating pattern, Forwards Number of Stairs: 10    Wheelchair Mobility    Modified Rankin (Stroke Patients Only)       Balance Overall balance assessment: No apparent balance deficits (not formally assessed)                                           Pertinent Vitals/Pain Pain Assessment Pain Assessment: Faces Faces Pain Scale: Hurts little more Pain Location: Neck and R shoulder Pain Descriptors / Indicators: Aching, Discomfort, Grimacing Pain Intervention(s): Limited activity within patient's tolerance    Home Living Family/patient expects to be discharged to:: Private residence Living Arrangements: Alone Available Help at Discharge: Friend(s);Available 24 hours/day Type of Home: House Home Access: Stairs to enter Entrance Stairs-Rails: Left Entrance Stairs-Number of Steps: 3 steps   Home Layout: One level Home Equipment: Cane - quad;Cane - single point;Shower seat - built in Additional Comments: Significant other will be staying with him until he has recovered    Prior Function Prior Level of Function : Independent/Modified Independent;Driving                     Hand Dominance   Dominant Hand: Right    Extremity/Trunk Assessment   Upper Extremity Assessment Upper Extremity Assessment: Defer to OT evaluation RUE Deficits / Details: Numb, decreased coordination, difficulty with motor function, shoulder strength 2/5, elbow/bicep 2/5, grip 2/5 RUE Sensation: decreased light touch;decreased proprioception RUE  Coordination: decreased fine motor;decreased gross motor LUE Deficits / Details: Decreased sensation and poor coordination LUE Sensation: decreased light touch LUE Coordination: decreased fine motor    Lower Extremity  Assessment Lower Extremity Assessment: Overall WFL for tasks assessed    Cervical / Trunk Assessment Cervical / Trunk Assessment: Neck Surgery  Communication   Communication: No difficulties  Cognition Arousal/Alertness: Awake/alert Behavior During Therapy: WFL for tasks assessed/performed Overall Cognitive Status: Within Functional Limits for tasks assessed                                          General Comments General comments (skin integrity, edema, etc.): VSS on RA, dressing appears dry and intact    Exercises     Assessment/Plan    PT Assessment Patient does not need any further PT services  PT Problem List         PT Treatment Interventions      PT Goals (Current goals can be found in the Care Plan section)  Acute Rehab PT Goals PT Goal Formulation: Patient unable to participate in goal setting    Frequency       Co-evaluation               AM-PAC PT "6 Clicks" Mobility  Outcome Measure Help needed turning from your back to your side while in a flat bed without using bedrails?: None Help needed moving from lying on your back to sitting on the side of a flat bed without using bedrails?: None Help needed moving to and from a bed to a chair (including a wheelchair)?: None Help needed standing up from a chair using your arms (e.g., wheelchair or bedside chair)?: None Help needed to walk in hospital room?: None Help needed climbing 3-5 steps with a railing? : None 6 Click Score: 24    End of Session Equipment Utilized During Treatment: Cervical collar Activity Tolerance: Patient tolerated treatment well Patient left: Other (comment) (standing in room)   PT Visit Diagnosis: Other abnormalities of gait and mobility (R26.89)    Time: 1517-6160 PT Time Calculation (min) (ACUTE ONLY): 10 min   Charges:   PT Evaluation $PT Eval Low Complexity: 1 Low          [facility] PT Acute Rehabilitation Services Pager  [phone] Office [phone]   Angelina Ok [facility] 06/11/2021, 11:13 AM

## 2021-06-11 NOTE — Progress Notes (Signed)
Patient alert and oriented, mae's well, voiding adequate amount of urine, swallowing without difficulty, no c/o pain at time of discharge. Patient discharged home with family. Script and discharged instructions given to patient. Patient and family stated understanding of instructions given. Patient has an appointment with Dr. Dumonski 

## 2021-06-11 NOTE — Progress Notes (Signed)
Placed patient on CPAP for the night via auto-mode.  

## 2021-06-11 NOTE — Evaluation (Signed)
Occupational Therapy Evaluation Patient Details Name: Jeffrey Nash MRN: [id number] DOB: 07-12-1965 Today's Date: 06/11/2021   History of Present Illness Pt adm 1/18 for ACDF C4-C7. PMH - multiple cervical fusions, multiple back surgeries, shoulder surgery, arthritis, migraines, HTN.   Clinical Impression   Pt admitted for procedure listed above. PTA pt reported that he was independent with all ADL's and Functional Mobility, however has been having increased difficulty for the past year with BUE. At this time, pt is able to complete all basic ADL's with no assist, however he does continue to demonstrate difficulties with fine motor and reports that his significant other will assist him for the time being. Pt has no further acute OT needs, however recommending OP OT for BUE once pt is able to participate in exercises and full ROM tasks, to assist with increasing strength, coordination, and decreasing sensations. Acute OT will sign off at this time.      Recommendations for follow up therapy are one component of a multi-disciplinary discharge planning process, led by the attending physician.  Recommendations may be updated based on patient status, additional functional criteria and insurance authorization.   Follow Up Recommendations  Outpatient OT (Once surgeon clears pt, would benefit from nuero/hand OT)    Assistance Recommended at Discharge PRN  Patient can return home with the following A little help with bathing/dressing/bathroom    Functional Status Assessment  Patient has had a recent decline in their functional status and demonstrates the ability to make significant improvements in function in a reasonable and predictable amount of time.  Equipment Recommendations  None recommended by OT    Recommendations for Other Services       Precautions / Restrictions Precautions Precautions: Cervical Precaution Booklet Issued: Yes (comment) Precaution Comments: Reviewed precautions and  compensatory strategies Required Braces or Orthoses: Cervical Brace Cervical Brace: Hard collar;At all times Restrictions Weight Bearing Restrictions: No      Mobility Bed Mobility Overal bed mobility: Modified Independent             General bed mobility comments: Reviewed log roll technique, requires increased time    Transfers Overall transfer level: Modified independent Equipment used: None               General transfer comment: No assist need, balance appears [facility]      Balance Overall balance assessment: No apparent balance deficits (not formally assessed)                                         ADL either performed or assessed with clinical judgement   ADL Overall ADL's : Modified independent;At baseline                                       General ADL Comments: Pt with continued difficulty with fine motor tasks, such as zippers and buttons. Otherwise, pt is able to complete ADL's with no difficulties     Vision Baseline Vision/History: 0 No visual deficits Ability to See in Adequate Light: 0 Adequate Patient Visual Report: No change from baseline Vision Assessment?: No apparent visual deficits     Perception     Praxis      Pertinent Vitals/Pain Pain Assessment Pain Assessment: Faces Faces Pain Scale: Hurts little more Pain Location: Neck and R shoulder Pain  Descriptors / Indicators: Aching, Discomfort, Grimacing Pain Intervention(s): Monitored during session, Patient requesting pain meds-RN notified, Repositioned     Hand Dominance Right   Extremity/Trunk Assessment Upper Extremity Assessment Upper Extremity Assessment: RUE deficits/detail;LUE deficits/detail RUE Deficits / Details: Numb, decreased coordination, difficulty with motor function, shoulder strength 2/5, elbow/bicep 2/5, grip 2/5 RUE Sensation: decreased light touch;decreased proprioception RUE Coordination: decreased fine motor;decreased  gross motor LUE Deficits / Details: Decreased sensation and poor coordination LUE Sensation: decreased light touch LUE Coordination: decreased fine motor   Lower Extremity Assessment Lower Extremity Assessment: Defer to PT evaluation   Cervical / Trunk Assessment Cervical / Trunk Assessment: Neck Surgery   Communication Communication Communication: No difficulties   Cognition Arousal/Alertness: Awake/alert Behavior During Therapy: WFL for tasks assessed/performed Overall Cognitive Status: Within Functional Limits for tasks assessed                                       General Comments  VSS on RA, dressing appears dry and intact    Exercises     Shoulder Instructions      Home Living Family/patient expects to be discharged to:: Private residence Living Arrangements: Alone Available Help at Discharge: Friend(s);Available 24 hours/day Type of Home: House Home Access: Stairs to enter Entergy CorporationEntrance Stairs-Number of Steps: 3 steps Entrance Stairs-Rails: Left Home Layout: One level     Bathroom Shower/Tub: Producer, television/film/videoWalk-in shower   Bathroom Toilet: Handicapped height Bathroom Accessibility: Yes How Accessible: Accessible via walker Home Equipment: Cane - quad;Cane - single point;Shower seat - built in   Additional Comments: Significant other will be staying with him until he has recovered      Prior Functioning/Environment Prior Level of Function : Independent/Modified Independent;Driving                        OT Problem List: Decreased strength;Decreased range of motion;Decreased activity tolerance;Decreased coordination;Decreased knowledge of precautions;Impaired UE functional use      OT Treatment/Interventions:      OT Goals(Current goals can be found in the care plan section) Acute Rehab OT Goals Patient Stated Goal: To go home OT Goal Formulation: All assessment and education complete, DC therapy Time For Goal Achievement: 06/11/21 Potential  to Achieve Goals: Good  OT Frequency:      Co-evaluation              AM-PAC OT "6 Clicks" Daily Activity     Outcome Measure Help from another person eating meals?: None Help from another person taking care of personal grooming?: None Help from another person toileting, which includes using toliet, bedpan, or urinal?: None Help from another person bathing (including washing, rinsing, drying)?: None Help from another person to put on and taking off regular upper body clothing?: None Help from another person to put on and taking off regular lower body clothing?: None 6 Click Score: 24   End of Session Equipment Utilized During Treatment: Cervical collar Nurse Communication: Mobility status  Activity Tolerance: Patient tolerated treatment well Patient left: in bed;with call bell/phone within reach  OT Visit Diagnosis: Unsteadiness on feet (R26.81);Other abnormalities of gait and mobility (R26.89);Muscle weakness (generalized) (M62.81)                Time: [id number] OT Time Calculation (min): 24 min Charges:  OT General Charges $OT Visit: 1 Visit OT Evaluation $OT Eval Moderate Complexity: 1 Mod OT Treatments $  Self Care/Home Management : 8-22 mins  Chukwuemeka Artola H., OTR/L Acute Rehabilitation  Tonimarie Gritz Elane Bing Plume 06/11/2021, 10:00 AM

## 2021-06-16 ENCOUNTER — Encounter (HOSPITAL_COMMUNITY): Payer: Self-pay | Admitting: Orthopedic Surgery

## 2021-06-18 NOTE — Discharge Summary (Signed)
Patient ID: Jeffrey Nash MRN: [id number] DOB/AGE: [birth year] 56 y.o.  Admit date: 06/10/2021 Discharge date: 06/11/2021  Admission Diagnoses:  Principal Problem:   Radiculopathy   Discharge Diagnoses:  Same  Past Medical History:  Diagnosis Date   Arthritis    Cervical radiculopathy    Headache    migraines   Hernia of abdominal wall    Hypertension    Nerve pain    Pneumonia 03/2021   Sleep apnea     Surgeries: Procedure(s): POSTERIOR CERVICAL DECOMPRESSION FUSION CERVICAL 4- CERVICAL 5, CERVICAL 5- CERVICAL 6, CERVICAL 6- CERVICAL 7 WITH INSTRUMENTATION AND ALLOGRAFT on 06/10/2021   Consultants: None  Discharged Condition: Improved  Hospital Course: Jeffrey Nash is an 56 y.o. male who was admitted 06/10/2021 for operative treatment of Radiculopathy. Patient has severe unremitting pain that affects sleep, daily activities, and work/hobbies. After pre-op clearance the patient was taken to the operating room on 06/10/2021 and underwent  Procedure(s): POSTERIOR CERVICAL DECOMPRESSION FUSION CERVICAL 4- CERVICAL 5, CERVICAL 5- CERVICAL 6, CERVICAL 6- CERVICAL 7 WITH INSTRUMENTATION AND ALLOGRAFT.    Patient was given perioperative antibiotics:  Anti-infectives (From admission, onward)    Start     Dose/Rate Route Frequency Ordered Stop   06/10/21 2345  ceFAZolin (ANCEF) IVPB 2g/100 mL premix        2 g 200 mL/hr over 30 Minutes Intravenous Every 8 hours 06/10/21 2016 06/11/21 0700   06/10/21 1030  ceFAZolin (ANCEF) IVPB 2g/100 mL premix        2 g 200 mL/hr over 30 Minutes Intravenous On call to O.R. 06/10/21 1029 06/10/21 1556        Patient was given sequential compression devices, early ambulation to prevent DVT.  Patient benefited maximally from hospital stay and there were no complications.    Recent vital signs: BP 139/78 (BP Location: Right Arm)    Pulse 96    Temp 98 F (36.7 C) (Oral)    Resp 18    Ht 6' (1.829 m)    Wt 110.7 kg    SpO2 98%    BMI 33.09  kg/m    Discharge Medications:   Allergies as of 06/11/2021       Reactions   Bee Venom Anaphylaxis, Swelling        Medication List     TAKE these medications    acetaminophen 650 MG CR tablet Commonly known as: TYLENOL Take 1,300 mg by mouth every 8 (eight) hours as needed for pain.   albuterol 108 (90 Base) MCG/ACT inhaler Commonly known as: VENTOLIN HFA Inhale 2 puffs into the lungs every 4 (four) hours as needed for shortness of breath.   EPINEPHrine 0.3 mg/0.3 mL Soaj injection Commonly known as: EPI-PEN Inject 0.3 mg into the muscle as needed for anaphylaxis.   lisinopril 20 MG tablet Commonly known as: ZESTRIL Take 20 mg by mouth daily.   methocarbamol 500 MG tablet Commonly known as: ROBAXIN Take 1 tablet (500 mg total) by mouth every 6 (six) hours as needed for muscle spasms.   OVER THE COUNTER MEDICATION Place 1 drop into both eyes daily as needed (redness). Rohto Max Strength   oxyCODONE-acetaminophen 5-325 MG tablet Commonly known as: PERCOCET/ROXICET Take 1-2 tablets by mouth every 4 (four) hours as needed for severe pain.        Diagnostic Studies: DG Cervical Spine 1 View  Result Date: 06/10/2021 CLINICAL DATA:  Intraoperative localization during C4-C7 fusion EXAM: DG CERVICAL SPINE - 1 VIEW COMPARISON:  12 in teen 57 FINDINGS: Lateral view of the cervical spine demonstrates surgical instrumentation overlying the C4 spinous process, with surgical probe projecting over the C4-5 facet joints. Fractured anterior fusion plate and intra corporal screws at C6 again noted. IMPRESSION: 1. Intraoperative localization as above, with instrumentation overlying the C4 spinous process and C4-5 facet joints. Electronically Signed   By: Randa Ngo M.D.   On: 06/10/2021 19:01   DG Cervical Spine 2 or 3 views  Result Date: 06/10/2021 CLINICAL DATA:  Posterior cervical decompression, fusion C4-C7. EXAM: CERVICAL SPINE - 2-3 VIEW COMPARISON:  Cervical spine x-ray  05/11/2021. FINDINGS: Intraoperative cervical spine. Three low resolution intraoperative spot views of the cervical spine were obtained. Posterior fusion hardware is new within the cervical spine. Anterior fusion hardware appears unchanged from prior including fractured screw at C6. Total fluoroscopy time: 18 seconds Total radiation dose: 3.38 micro Gy IMPRESSION: Intraoperative cervical spine posterior fusion. Electronically Signed   By: Ronney Asters M.D.   On: 06/10/2021 19:05   DG C-Arm 1-60 Min-No Report  Result Date: 06/10/2021 Fluoroscopy was utilized by the requesting physician.  No radiographic interpretation.   DG C-Arm 1-60 Min-No Report  Result Date: 06/10/2021 Fluoroscopy was utilized by the requesting physician.  No radiographic interpretation.   DG C-Arm 1-60 Min-No Report  Result Date: 06/10/2021 Fluoroscopy was utilized by the requesting physician.  No radiographic interpretation.    Disposition: Discharge disposition: 01-Home or Self Care     POD #1 s/p C4-C7 decompression and fusion, doing well with improved left arm symptoms and curious right finger swelling and pain with ROM   - encourage ambulation - Percocet for pain, Robaxin for muscle spasms -Scripts for pain sent to pharmacy electronically  -D/C instructions sheet printed and in chart -D/C today  -F/U in office 2 weeks   Signed: Justice Britain 06/18/2021, 12:53 PM

## 2023-12-28 IMAGING — CR DG CERVICAL SPINE 1V
1 series · 1 of 1 positions shown · non-contrast
Comparison: 12 in teen 20

CLINICAL DATA: Intraoperative localization during C4-C7 fusion

EXAM:
DG CERVICAL SPINE - 1 VIEW

[xtable]
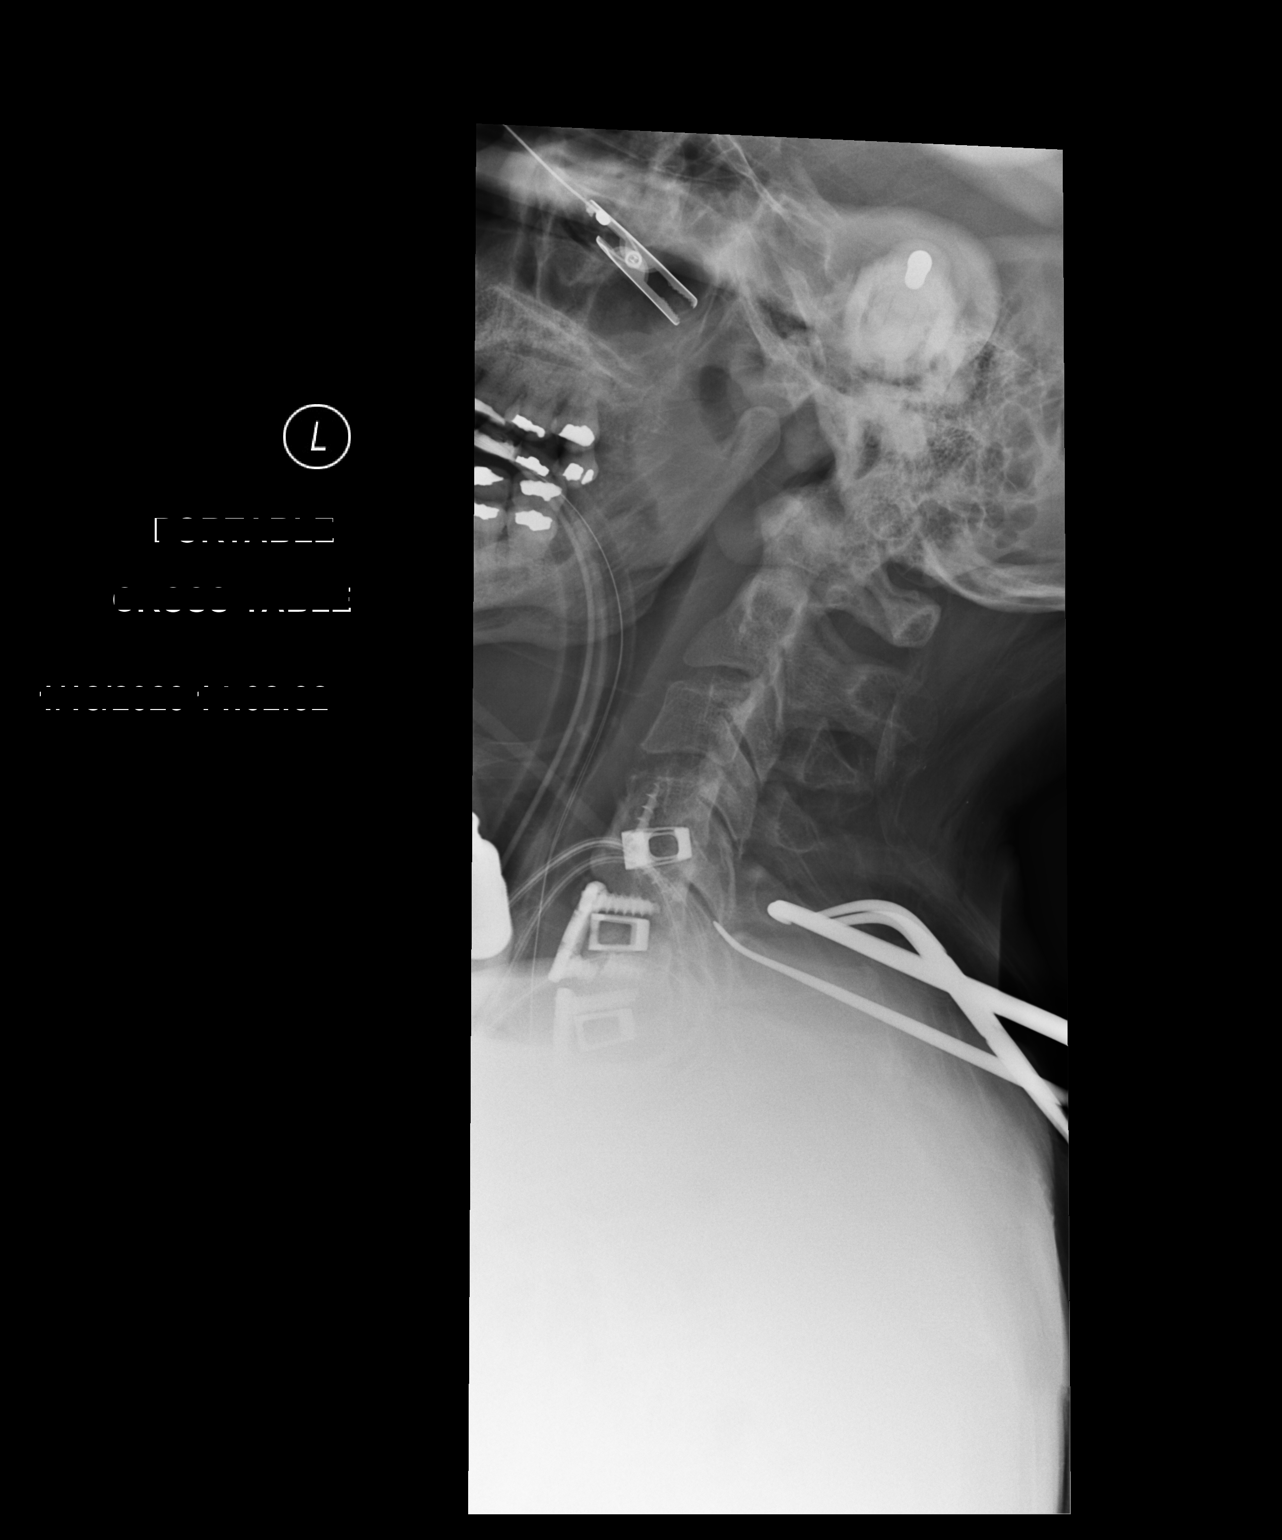

[1 of 1 positions shown; findings below may reference images not displayed]

FINDINGS: Lateral view of the cervical spine demonstrates surgical
instrumentation overlying the C4 spinous process, with surgical
probe projecting over the C4-5 facet joints. Fractured anterior
fusion plate and intra corporal screws at C6 again noted.
IMPRESSION: 1. Intraoperative localization as above, with instrumentation
overlying the C4 spinous process and C4-5 facet joints.
